# Patient Record
Sex: Male | Born: 1965
Health system: Southern US, Community
[De-identification: ages and names within clinical notes are randomized; demographics above are authoritative.]

## PROBLEM LIST (undated history)

## (undated) DIAGNOSIS — I10 Essential (primary) hypertension: Secondary | ICD-10-CM

## (undated) DIAGNOSIS — I5030 Unspecified diastolic (congestive) heart failure: Secondary | ICD-10-CM

## (undated) DIAGNOSIS — I509 Heart failure, unspecified: Secondary | ICD-10-CM

## (undated) DIAGNOSIS — J9601 Acute respiratory failure with hypoxia: Secondary | ICD-10-CM

## (undated) DIAGNOSIS — N289 Disorder of kidney and ureter, unspecified: Secondary | ICD-10-CM

## (undated) HISTORY — PX: OTHER SURGICAL HISTORY: SHX169

## (undated) HISTORY — PX: SPINE SURGERY: SHX786

## (undated) HISTORY — PX: JOINT REPLACEMENT: SHX530

## (undated) HISTORY — PX: ROTATOR CUFF REPAIR: SHX139

---

## 1998-08-12 ENCOUNTER — Emergency Department (HOSPITAL_COMMUNITY): Admission: EM | Admit: 1998-08-12 | Discharge: 1998-08-12 | Payer: Self-pay

## 1998-11-28 ENCOUNTER — Emergency Department (HOSPITAL_COMMUNITY): Admission: EM | Admit: 1998-11-28 | Discharge: 1998-11-28 | Payer: Self-pay | Admitting: Emergency Medicine

## 2000-04-14 ENCOUNTER — Emergency Department (HOSPITAL_COMMUNITY): Admission: EM | Admit: 2000-04-14 | Discharge: 2000-04-14 | Payer: Self-pay | Admitting: Emergency Medicine

## 2000-04-26 ENCOUNTER — Emergency Department (HOSPITAL_COMMUNITY): Admission: EM | Admit: 2000-04-26 | Discharge: 2000-04-26 | Payer: Self-pay | Admitting: Emergency Medicine

## 2000-05-03 ENCOUNTER — Emergency Department (HOSPITAL_COMMUNITY): Admission: EM | Admit: 2000-05-03 | Discharge: 2000-05-03 | Payer: Self-pay | Admitting: Emergency Medicine

## 2000-11-09 ENCOUNTER — Emergency Department (HOSPITAL_COMMUNITY): Admission: EM | Admit: 2000-11-09 | Discharge: 2000-11-09 | Payer: Self-pay

## 2000-11-11 ENCOUNTER — Emergency Department (HOSPITAL_COMMUNITY): Admission: EM | Admit: 2000-11-11 | Discharge: 2000-11-11 | Payer: Self-pay | Admitting: Emergency Medicine

## 2001-02-11 ENCOUNTER — Encounter: Payer: Self-pay | Admitting: Neurosurgery

## 2001-02-11 ENCOUNTER — Ambulatory Visit (HOSPITAL_COMMUNITY): Admission: RE | Admit: 2001-02-11 | Discharge: 2001-02-11 | Payer: Self-pay | Admitting: Neurosurgery

## 2001-03-10 ENCOUNTER — Encounter: Payer: Self-pay | Admitting: Neurosurgery

## 2001-03-10 ENCOUNTER — Ambulatory Visit (HOSPITAL_COMMUNITY): Admission: RE | Admit: 2001-03-10 | Discharge: 2001-03-10 | Payer: Self-pay | Admitting: Neurosurgery

## 2001-10-10 ENCOUNTER — Emergency Department (HOSPITAL_COMMUNITY): Admission: EM | Admit: 2001-10-10 | Discharge: 2001-10-10 | Payer: Self-pay

## 2001-10-10 ENCOUNTER — Encounter: Payer: Self-pay | Admitting: Emergency Medicine

## 2004-06-12 ENCOUNTER — Emergency Department (HOSPITAL_COMMUNITY): Admission: EM | Admit: 2004-06-12 | Discharge: 2004-06-12 | Payer: Self-pay | Admitting: Emergency Medicine

## 2004-11-09 ENCOUNTER — Ambulatory Visit (HOSPITAL_COMMUNITY): Admission: RE | Admit: 2004-11-09 | Discharge: 2004-11-09 | Payer: Self-pay | Admitting: Orthopedic Surgery

## 2004-11-26 ENCOUNTER — Encounter: Admission: RE | Admit: 2004-11-26 | Discharge: 2004-11-26 | Payer: Self-pay | Admitting: Orthopedic Surgery

## 2004-12-06 ENCOUNTER — Ambulatory Visit (HOSPITAL_BASED_OUTPATIENT_CLINIC_OR_DEPARTMENT_OTHER): Admission: RE | Admit: 2004-12-06 | Discharge: 2004-12-06 | Payer: Self-pay | Admitting: Orthopedic Surgery

## 2005-03-07 ENCOUNTER — Encounter: Admission: RE | Admit: 2005-03-07 | Discharge: 2005-04-10 | Payer: Self-pay | Admitting: Orthopedic Surgery

## 2006-07-13 ENCOUNTER — Emergency Department (HOSPITAL_COMMUNITY): Admission: EM | Admit: 2006-07-13 | Discharge: 2006-07-14 | Payer: Self-pay | Admitting: Emergency Medicine

## 2007-12-13 ENCOUNTER — Emergency Department (HOSPITAL_COMMUNITY): Admission: EM | Admit: 2007-12-13 | Discharge: 2007-12-13 | Payer: Self-pay | Admitting: Emergency Medicine

## 2009-06-07 ENCOUNTER — Emergency Department (HOSPITAL_COMMUNITY): Admission: EM | Admit: 2009-06-07 | Discharge: 2009-06-07 | Payer: Self-pay | Admitting: Emergency Medicine

## 2010-11-09 ENCOUNTER — Encounter: Payer: Self-pay | Admitting: Orthopedic Surgery

## 2011-01-25 LAB — POCT I-STAT, CHEM 8
Glucose, Bld: 101 mg/dL — ABNORMAL HIGH (ref 70–99)
HCT: 50 % (ref 39.0–52.0)
Hemoglobin: 17 g/dL (ref 13.0–17.0)
Potassium: 3.7 mEq/L (ref 3.5–5.1)
Sodium: 140 mEq/L (ref 135–145)

## 2011-03-07 NOTE — Op Note (Signed)
Curtis Patton, Curtis Patton               ACCOUNT NO.:  [id number]   MEDICAL RECORD NO.:  [id number]          PATIENT TYPE:  AMB   LOCATION:  DSC                          FACILITY:  [facility]   PHYSICIAN:  Katy Fitch. Sypher Jr., M.D.DATE OF BIRTH:  02/07/1966   DATE OF PROCEDURE:  12/06/2004  DATE OF DISCHARGE:                                 OPERATIVE REPORT   ,   PREOPERATIVE DIAGNOSES:  1.  Large intramedullary lesion of right proximal humerus documented plain      films and MRI with differential diagnosis of unicameral bone cyst versus      fibrous dysplasia versus possible low-grade chondroid tumor, status post      unsuccessful needle biopsy by Dr. Antionette Patton of radiology.  2.  MRI-documented rotator cuff tear of right supraspinatus and      infraspinatus tendons with acromioclavicular arthropathy and chronic      stage III impingement of right shoulder.   POSTOPERATIVE DIAGNOSES:  1.  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  2.  Ninety percent deep surface tear of supraspinatus and infraspinatus      documented by MRI and digital photography.  3.  Acromioclavicular arthropathy leading to chronic sub-acromioclavicular      impingement.   OPERATION:  1.  Open biopsy of right proximal humeral lesion with curettage of probable      simple bone cyst and instillation of Depo-Medrol.  2.  Arthroscopic evaluation of right glenohumeral joint documenting deep      surface rotator cuff tear, followed by open repair of supraspinatus and      infraspinatus tendons to a decorticated greater tuberosity with multiple      suture anchors and through-bone McLaughlin sutures.  3.  Open resection of distal clavicle and subacromial decompression.   OPERATING SURGEON:  Katy Fitch. Sypher, M.D.   ASSISTANT:  Curtis Patton, P.A.   ANESTHESIA:  General  endotracheal.   SUPERVISING ANESTHESIOLOGIST:  Janetta Hora. Gelene Mink, M.D.   INDICATIONS:  Curtis Patton is a 45 year old mane referred by Dr.  Renaye Patton for evaluation and management of a painful right shoulder.   Clinical examination revealed signs of probable impingement and weakness of  abduction suggestive the rotator cuff tear.  Plain x-rays of the shoulder  documented a large intermaxillary lesion of the proximal humerus that had a  smooth border without signs of lysis suggestive of a simple bone cyst versus  fibrous dysplasia versus a possible chondroid lesion.   Curtis Patton had significant AC arthropathy with a rather unusual anatomy of  AC joint with a large lateral overlap of the clavicle on the acromion with a  very prominent inferior acromion.   There was marked sclerosis of the greater tuberosity consistent with chronic  rotator cuff injury.   He was referred for MRI of the shoulder which documented a 90% deep surface  rotator cuff tear and revealed that his lesion highlighted on T2  in the  proximal humerus.   The radiologist proposed a needle biopsy that was attempted by Dr. Antionette Patton in the week prior to surgery. Unfortunately, Dr. Barrington Patton did not  obtain an adequate amount material from the lesion for diagnosis.   Curtis Patton wanted to proceed with open bone biopsy for diagnosis as the  radiologist could not give [place] a firm diagnosis that this was not a chondroid  lesion.   After documentation of the rotator cuff tear, we recommended proceeding with  a combined procedure at this time, initially with the open bone biopsy with  plans to abort the rotator cuff procedure if we found a possible malignancy.   PROCEDURE:  Curtis Patton was brought to operating room and placed in a  supine position on the table.   Following anesthesia consultation by Dr. Gelene Mink, an interscalene block  was placed holding area.   Curtis Patton was transferred to the operating  room and placed in supine  position on the operating table.   Following induction general orotracheal anesthesia, he was carefully  positioned in a beach-chair position with  the aid of a torso and head-  holder designed for shoulder arthroscopy.   The entire right upper extremity and forequarter were prepped with DuraPrep  and draped with impervious arthroscopy drapes.   A spinal needle was used with a sterile C-arm fluoroscope to identify the  medullary lesion of the humerus.   A marking pen was used to outline the lesion.  We determine that this would  be appropriately approached through a deltopectoral/axillary incision and  should be reasonably cosmetic.   After administration of 1 gram of Ancef as an IV prophylactic antibiotic.  the procedure commenced with a 4-cm incision in the deltopectoral groove,  extending towards axillary fold.   The cephalic vein was identified between the deltoid and the pectoralis  major.   Blunt retractors were placed beneath the deltoid, exposing the insertion of  the pectoralis major.   The pectoralis major insertion was incised in a T-shaped manner, splitting  the fibers medially and elevating a small portion of the tendon off of the  humerus.   Through this gap in the tendon, I performed a provisional location of the  lesion by placing a bone awl followed by a small-caliber drill directly into  the lesion.   We then proceeded to create a diamond-shaped pattern of small drill holes  that were enlarged with a 3/16-inch drill to create a round window into the  proximal humeral lesion.   When we entered the lesion, there was simply a fluid-filled space.  There  was no blood under pressure,  amber fluid that we have associated with  childhood cysts and no solid material that one would associate with a  chondroid lesion.   A series of fine curettes were used within the intermaxillary canal to carefully scrape the wall cyst and try to  recover some type of membrane or  chondroid material.   We basically recovered serum and blood.   At that point, I elected to inject the cyst with 80 mg of Depo-Medrol in  effort to help the cyst involution.   The entry hole was then covered by closure of the flaps of the pectoralis  major tendon, which should seal with Depo-Medrol in place.   The wound was irrigated thoroughly and the skin repaired.  There was no  effort to repair the deltopectoral interval.  The skin was repaired with  subdermal  sutures of 3-0 Vicryl and intradermal 3-0 Prolene with Steri-  Strips.   The instruments used for the bone biopsy were discarded, followed by  preparation for shoulder arthroscopy.   A spinal needle was placed within the glenohumeral joint and used to distend  the joint with 20 mL of sterile saline, followed by placement of the  arthroscope in the posterior portal with a blunt trocar.  Diagnostic  arthroscopy immediately documented the cuff tear.  The remainder of the  glenohumeral joint was normal with a normal-appearing labrum, subscapularis,  normal anterior glenohumeral ligaments, normal superior and inferior recess  and a normal-appearing posterior teres minor tendon.   Most of supraspinatus and a portion anterior infraspinatus had a  degenerative tear that had fully separated from the greater tuberosity and a  small bursal covering laterally.   Photographic documentation was accomplished followed by removal of the scope  and placement of the scope and subacromial space.  The bursa was intact over  the tear in the subacromial space.   At this point we converted to an open repair of the tendon.  The unusual  anatomy at the [facility] joint made open resection of the distal clavicle the  appropriate choice.   The anterior acromion and AC joint were exposed through a 3-cm incision  followed by careful elevation of the anterior deltoid off the capsule of AC  joint and the anterior  acromion.  There was a very complex anatomy of the [facility]  joint.  The clavicle overlapped the acromion approximately 1.5 cm and had a  rather large inferior osteophyte and broad contour to the underlapped  portion of the acromion.  There was a thick coracoacromial ligament  underneath the anterior acromion.   The acromion was leveled to a type 1 morphology and the coracoacromial  ligament released.  The distal 10 mm of clavicle were resected at the most  medial portion of the acromion and the overlapping portion was resected. A  rongeur was used to remove the sharp medial corner of the acromion, followed  by use of a power bur to contour the acromion laterally and medially to a  flat surface.   The tear was easily palpated, followed by incision of the bursa, revealing a  4-cm-wide strip of necrotic tendon that had separated from the greater  tuberosity.   The tendon was thoroughly debrided with a sharp rongeur, followed by decortication of the tuberosity with a power bur and drilling a number of  holes with a 0.05-inch Kirschner wire to facilitate blood supply returning  to the tendon.   Two Bio corkscrew anchors were placed, one deep to the supraspinatus and one  deep to the infraspinatus, followed by a McLaughlin baseball stitch at the T  portion of the incision between the tendons.   The McLaughlin stitch was placed through bone tunnels and tied over the  lateral cortex, followed by tying a total of 4 mattress sutures, insetting  the supraspinatus and infraspinatus into the decorticated tuberosity.   A very satisfactory repair was achieved.   The scope was then replaced in the shoulder joint and the repair confirmed  to be anatomic.   The wound was then irrigated thoroughly sterile saline, followed by repair  the anterior third of deltoid to the trapezius muscle, closing the dead  space created by distal clavicle resection, and repair of the anterior third  of the deltoid to the  acromion.   There were no apparent complications.   Mr. Burby  wounds were then thoroughly irrigated and repaired with  subdermal sutures of 0 and 3-0 Vicryl and intradermal 3-0 Prolene with Steri-  Strips.   A compressive dressing was applied with Xeroflo, sterile gauze and Hypafix.   Mr. Duque will [facility] and we anticipate  discharge in 24 hours after dressing change to OpSite dressing.      RVS/MEDQ  D:  12/06/2004  T:  12/07/2004  Job:  [id number]

## 2011-03-07 NOTE — Op Note (Signed)
[facility]  Patient:    Curtis Patton, Curtis Patton                      MRN: [id number] Proc. Date: 02/11/01 Adm. Date:  [id number] Disc. Date: [id number] Attending:  Gerald Dexter                           Operative Report  PREOPERATIVE DIAGNOSIS:  Herniated disk at C6-7 right.  POSTOPERATIVE DIAGNOSIS:  Herniated disk at C6-7 right.  OPERATION PERFORMED:  C6-7 anterior cervical diskectomy with fibular bone bank fusion followed by Zephyr anterior cervical plating.  SURGEON:  Reinaldo Meeker, M.D.  ASSISTANT:  Donalee Citrin, Montez Hageman.  ANESTHESIA:  DESCRIPTION OF PROCEDURE:  After being placed in the supine position in 10 pounds halter traction, the patients neck was prepped and draped in the usual sterile fashion.  A localizing x-ray was taken prior to incision to identify the appropriate level.  A transverse incision was made at the right anterior neck starting at the midline heading toward the medial aspect of the left sternocleidomastoid muscle.  The platysma muscle was then incised transversely.  The natural fascial plane between the strap muscles medially and the sternocleidomastoid laterally was identified and followed down to the anterior aspect of the cervical spine.  The longus colli muscles were identified and split in the midline, stripped away bilaterally with the [employer] manager.  A self-retaining retractor was placed for exposure.  A second fluoroscopy was used to identify approach to the C6-7 level and this was correct.  Using the 15 blade, the annulus of the C6-7 disk was incised.  Using pituitary rongeurs and curets, approximately 90% of the disk material was removed.  A high speed drill was used to widen the interspace.  The microscope was draped, brought into the field and used for the remainder of the case.  Using microdissection technique, the remainder of the disk material down to the posterior longitudinal ligament  was removed. The ligament was then incised transversely.  The cut edges were removed with the Kerrison punch.  Inspection out the right C6-7 foramen yielded a large number of large disk fragments.  These were removed to give excellent decompression of the underlying C7 nerve root.  Proximal foraminal decompression was carried out on the left, asymptomatic side.  At this point inspection was carried out in all directions for any evidence of residual compression and none could be identified.  Large amounts of irrigation were carried out.  Measurements were taken and a 7 mm bone bank plug was reconstituted.  After irrigating once more and confirming hemostasis, the plug was then impacted without difficulty.  Microscope was removed and fluoroscopy brought back into the field.  Under fluoroscopic guidance, a separate anterior plate was placed by drilling pilot holes and then placing 15 mm screws x 4.  A locking mechanism was then engaged and then fluoroscopy showed the plate to be in excellent position at this time.  Once again, large amounts of irrigation were carried out and any bleeding controlled with bipolar coagulation.  The wound was then closed using interrupted Vicryl on the platysma muscle and inverted 5-0 PDS in the subcuticular layer and Steri-Strips on the skin.  Sterile dressing and collar were applied.  Patient was extubated and taken to the recovery room in stable condition. DD:  02/11/01 TD:  02/12/01 Job: [id number]

## 2011-07-11 LAB — CULTURE, ROUTINE-ABSCESS

## 2011-09-28 ENCOUNTER — Encounter: Payer: Self-pay | Admitting: Emergency Medicine

## 2011-09-28 ENCOUNTER — Emergency Department (HOSPITAL_COMMUNITY)
Admission: EM | Admit: 2011-09-28 | Discharge: 2011-09-28 | Disposition: A | Discharge status: Home / Self Care | Payer: Self-pay | Attending: Emergency Medicine | Admitting: Emergency Medicine

## 2011-09-28 DIAGNOSIS — G43909 Migraine, unspecified, not intractable, without status migrainosus: Secondary | ICD-10-CM | POA: Insufficient documentation

## 2011-09-28 DIAGNOSIS — F17200 Nicotine dependence, unspecified, uncomplicated: Secondary | ICD-10-CM | POA: Insufficient documentation

## 2011-09-28 DIAGNOSIS — I10 Essential (primary) hypertension: Secondary | ICD-10-CM | POA: Insufficient documentation

## 2011-09-28 DIAGNOSIS — Z79899 Other long term (current) drug therapy: Secondary | ICD-10-CM | POA: Insufficient documentation

## 2011-09-28 HISTORY — DX: Essential (primary) hypertension: I10

## 2011-09-28 MED ORDER — SUMATRIPTAN SUCCINATE 6 MG/0.5ML ~~LOC~~ SOLN
6.0000 mg | Freq: Once | SUBCUTANEOUS | Status: AC
  Administered 2011-09-28: 6 mg via SUBCUTANEOUS
  Filled 2011-09-28: qty 0.5

## 2011-09-28 MED ORDER — SUMATRIPTAN SUCCINATE 50 MG PO TABS: 50.0000 mg | ORAL_TABLET | ORAL | Status: DC | PRN

## 2011-09-28 NOTE — ED Notes (Signed)
Pt states having a migraine onset this am.

## 2011-09-28 NOTE — ED Provider Notes (Signed)
History     CSN: [id number] Arrival date & time: 09/28/2011  7:48 AM   First MD Initiated Contact with Patient 09/28/11 [id number]      Chief Complaint  Patient presents with  . Migraine    (Consider location/radiation/quality/duration/timing/severity/associated sxs/prior treatment) HPI Comments: Patient with history of migraines presents with onset of typical migraine symptoms beginning this morning at 1 AM. Patient typically Imitrex for his symptoms however he has run out of not taken today. Patient describes photophobia, phonophobia, vomiting which is typical. Patient denies vision change or aura. Patient denies head injury. Patient states he has some nasal congestion but otherwise no other symptoms. Patient's wife states he has been acting normally except he appears to be in pain.  Patient is a 45 y.o. male presenting with migraine. The history is provided by the patient and a relative.  Migraine This is a recurrent problem. The current episode started today. Associated symptoms include congestion, headaches, nausea and vomiting. Pertinent negatives include no abdominal pain, chest pain, coughing, fever, myalgias, neck pain, numbness, rash, sore throat or weakness.    Past Medical History  Diagnosis Date  . Migraine   . Hypertension     Past Surgical History  Procedure Date  . Neck   . Rotator cuff repair     History reviewed. No pertinent family history.  History  Substance Use Topics  . Smoking status: Current Everyday Smoker -- 2.0 packs/day    Types: Cigarettes, Cigars  . Smokeless tobacco: Not on file  . Alcohol Use: No      Review of Systems  Constitutional: Negative for fever.  HENT: Positive for congestion. Negative for sore throat, rhinorrhea, neck pain and neck stiffness.   Eyes: Negative for redness.  Respiratory: Negative for cough and shortness of breath.   Cardiovascular: Negative for chest pain.  Gastrointestinal: Positive for nausea and vomiting.  Negative for abdominal pain, diarrhea and constipation.  Genitourinary: Negative for dysuria.  Musculoskeletal: Negative for myalgias.  Skin: Negative for rash.  Neurological: Positive for headaches. Negative for dizziness, weakness and numbness.    Allergies  Review of patient's allergies indicates no known allergies.  Home Medications   Current Outpatient Rx  Name Route Sig Dispense Refill  . HYDROCHLOROTHIAZIDE 25 MG PO TABS Oral Take 25 mg by mouth every morning.      Marland Kitchen NAPROXEN SODIUM 220 MG PO TABS Oral Take 220 mg by mouth 2 (two) times daily with a meal. pain       BP 171/91  Pulse 62  Temp(Src) 98.4 F (36.9 C) (Oral)  Resp 18  SpO2 100%  Physical Exam  Nursing note and vitals reviewed. Constitutional: He is oriented to person, place, and time. He appears well-developed and well-nourished.       Patient lying in dark room.  HENT:  Head: Normocephalic and atraumatic.  Eyes: Conjunctivae are normal. Pupils are equal, round, and reactive to light. Right eye exhibits no discharge. Left eye exhibits no discharge.  Neck: Normal range of motion. Neck supple.  Cardiovascular: Normal rate, regular rhythm and normal heart sounds.   Pulmonary/Chest: Effort normal and breath sounds normal.  Musculoskeletal: He exhibits no edema.  Neurological: He is alert and oriented to person, place, and time. He has normal strength. No cranial nerve deficit or sensory deficit. Coordination normal. GCS eye subscore is 4. GCS verbal subscore is 5. GCS motor subscore is 6.  Skin: Skin is warm and dry.  Psychiatric: He has a normal mood and affect.  ED Course  Procedures (including critical care time)  Labs Reviewed - No data to display No results found.   1. Migraine     9:47 AM patient seen and examined. He is having his typical migrainous symptoms with head injury. Imitrex ordered. Will reevaluate.  11:30 AM migraine symptoms resolved. Will discharge to home with a prescription  for sumatriptan. Patient urged to follow up with primary care doctor for management of his headaches.  MDM  Patient with typical migraine symptoms, no neurological deficits. No concern for intracranial injury or subarachnoid hemorrhage.         Carolee Rota, [name] 09/28/11 1131

## 2011-09-29 NOTE — ED Provider Notes (Signed)
Evaluation and management procedures were performed by the mid-level provider (PA/NP/CNM) under my supervision/collaboration. I was present and available during the ED course. Jd Mccaster Y.   Gavin Pound. Colie Josten, MD 09/29/11 1153

## 2013-02-17 ENCOUNTER — Ambulatory Visit (INDEPENDENT_AMBULATORY_CARE_PROVIDER_SITE_OTHER): Payer: No Typology Code available for payment source | Admitting: Family Medicine

## 2013-02-17 VITALS — BP 143/89 | HR 70 | Temp 97.9°F | Resp 18 | Ht 73.0 in | Wt 209.0 lb

## 2013-02-17 DIAGNOSIS — B350 Tinea barbae and tinea capitis: Secondary | ICD-10-CM

## 2013-02-17 DIAGNOSIS — L02818 Cutaneous abscess of other sites: Secondary | ICD-10-CM

## 2013-02-17 DIAGNOSIS — Z79899 Other long term (current) drug therapy: Secondary | ICD-10-CM

## 2013-02-17 DIAGNOSIS — L03811 Cellulitis of head [any part, except face]: Secondary | ICD-10-CM

## 2013-02-17 MED ORDER — CIPROFLOXACIN HCL 500 MG PO TABS: 500.0000 mg | ORAL_TABLET | Freq: Two times a day (BID) | ORAL | Status: DC

## 2013-02-17 MED ORDER — TERBINAFINE HCL 250 MG PO TABS: 250.0000 mg | ORAL_TABLET | Freq: Every day | ORAL | Status: DC

## 2013-02-17 NOTE — Progress Notes (Signed)
  Subjective:    Patient ID: Curtis Patton, male    DOB: [birth year], 47 y.o.   MRN: [id number]  HPI Curtis Patton is a 47 y.o. male  Bumps on back of head - noticed few months ago - occasional discharge. No fever, multiple ones on back of head now, and with one area next to eye - past few months.  Had been getting hair cut closer, and hair oil when this first started.  Seen by other medical clinic 1 month ago - treated with cortisone cream and some type of shampoo - felt like it made it worse.  Tx; otc medicated shampoo.   Review of Systems  Constitutional: Negative for fever and chills.       Objective:   Physical Exam  Vitals reviewed. Constitutional: He is oriented to person, place, and time. He appears well-developed and well-nourished.  HENT:  Head:    Right Ear: External ear normal.  Left Ear: External ear normal.  Eyes: EOM are normal. Pupils are equal, round, and reactive to light.    Pulmonary/Chest: Effort normal.  Neurological: He is alert and oriented to person, place, and time.  Skin: Skin is warm.  As above. No LAD in scalp appreciated.   Psychiatric: He has a normal mood and affect. His behavior is normal.  scraping of scalp - noted fungal elements under scope.      Assessment & Plan:  Curtis CUPPLES is a 47 y.o. male Tinea capitis - Plan: terbinafine (LAMISIL) 250 MG tablet  Abscess or cellulitis of scalp - Plan: ciprofloxacin (CIPRO) 500 MG tablet, Wound culture  High risk medication use - Plan: Comprehensive metabolic panel  Suspected initial  Tinea capitis with secondary abscesses vs. Kerion. I and D per procedure note -  Cover with antifungal for 4 weeks, and abx that has pseudomonal coverage - cipro for 10 days. . Warm compresses. rtc precautions and instructions below.   Meds ordered this encounter  Medications  . terbinafine (LAMISIL) 250 MG tablet    Sig: Take 1 tablet (250 mg total) by mouth daily.    Dispense:  28 tablet    Refill:  0    . ciprofloxacin (CIPRO) 500 MG tablet    Sig: Take 1 tablet (500 mg total) by mouth 2 (two) times daily.    Dispense:  20 tablet    Refill:  0    Patient Instructions  Start the antifungal and antibiotic for the infection in your scalp.  Hot compresses 3-4 times per day over area to allow these area to drain.  Let your hair grow out a little longer to lessen chance of recurrence. Recheck in next week to 10 days if not improving. Return to the clinic or go to the nearest emergency room if any of your symptoms worsen or new symptoms occur. Your should receive a call or letter about your lab results within the next week to 10 days.  '

## 2013-02-17 NOTE — Progress Notes (Signed)
Procedure Note: Verbal consent obtained from the patient.  Local anesthesia with 1cc 2% plain lidocaine.  Betadine prep.  Three small incisions made with 11 blade over most fluctuant areas.  Moderate amount of purulence expressed from most superior lesion.  Minimal purulence expressed from inferior lesions.  Hemostasis with direct pressure.  Dressing applied.  Wound care discussed.

## 2013-02-17 NOTE — Patient Instructions (Addendum)
Start the antifungal and antibiotic for the infection in your scalp.  Hot compresses 3-4 times per day over area to allow these area to drain.  Let your hair grow out a little longer to lessen chance of recurrence. Recheck in next week to 10 days if not improving. Return to the clinic or go to the nearest emergency room if any of your symptoms worsen or new symptoms occur. Your should receive a call or letter about your lab results within the next week to 10 days.  '

## 2013-02-18 LAB — COMPREHENSIVE METABOLIC PANEL
Albumin: 4.3 g/dL (ref 3.5–5.2)
CO2: 24 mEq/L (ref 19–32)
Glucose, Bld: 75 mg/dL (ref 70–99)
Sodium: 138 mEq/L (ref 135–145)
Total Bilirubin: 0.7 mg/dL (ref 0.3–1.2)
Total Protein: 7 g/dL (ref 6.0–8.3)

## 2013-02-21 LAB — WOUND CULTURE
Gram Stain: NONE SEEN
Gram Stain: NONE SEEN

## 2013-04-06 ENCOUNTER — Encounter (HOSPITAL_COMMUNITY): Payer: Self-pay

## 2013-04-06 ENCOUNTER — Emergency Department (INDEPENDENT_AMBULATORY_CARE_PROVIDER_SITE_OTHER)
Admission: EM | Admit: 2013-04-06 | Discharge: 2013-04-06 | Disposition: A | Discharge status: Home / Self Care | Payer: No Typology Code available for payment source | Source: Home / Self Care | Attending: Emergency Medicine | Admitting: Emergency Medicine

## 2013-04-06 DIAGNOSIS — L03211 Cellulitis of face: Secondary | ICD-10-CM

## 2013-04-06 DIAGNOSIS — L03818 Cellulitis of other sites: Secondary | ICD-10-CM

## 2013-04-06 DIAGNOSIS — L0201 Cutaneous abscess of face: Secondary | ICD-10-CM

## 2013-04-06 DIAGNOSIS — L02811 Cutaneous abscess of head [any part, except face]: Secondary | ICD-10-CM

## 2013-04-06 MED ORDER — HYDROCODONE-ACETAMINOPHEN 5-325 MG PO TABS: 1.0000 | ORAL_TABLET | Freq: Four times a day (QID) | ORAL | Status: DC | PRN

## 2013-04-06 MED ORDER — DOXYCYCLINE HYCLATE 100 MG PO CAPS: 100.0000 mg | ORAL_CAPSULE | Freq: Two times a day (BID) | ORAL | Status: DC

## 2013-04-06 NOTE — ED Notes (Signed)
Reports painful swollen areas on face, scalp x couple of months

## 2013-04-06 NOTE — ED Provider Notes (Signed)
Medical screening examination/treatment/procedure(s) were performed by non-physician practitioner and as supervising physician I was immediately available for consultation/collaboration.  Raynald Blend, MD 04/06/13 [phone]

## 2013-04-06 NOTE — ED Provider Notes (Signed)
History     CSN: [id number]  Arrival date & time 04/06/13  1001   First MD Initiated Contact with Patient 04/06/13 1029      Chief Complaint  Patient presents with  . Facial Swelling    (Consider location/radiation/quality/duration/timing/severity/associated sxs/prior treatment) HPI  47 yo bm presents today with multiple painful abscesses on back of scalp and right upper cheek.  Scalp has been ongoing x 4 months after he got a haircut.  Was treated by 2 urgent cares and most recently by [place] early May.  Given scripts for cipro and lamisil.  Abscesses never resolved with treatment.  They did send cultures but no predominant organism.  He admits foul smelling, purulent drainage from the back of the head.  Doing warm compresses.  Facial abscess for a few weeks.  No drainage.  Denies fever, chills, lightheadedness, vision changes.    Past Medical History  Diagnosis Date  . Migraine   . Hypertension     Past Surgical History  Procedure Laterality Date  . Neck    . Rotator cuff repair    . Joint replacement    . Spine surgery      History reviewed. No pertinent family history.  History  Substance Use Topics  . Smoking status: Current Every Day Smoker -- 2.00 packs/day    Types: Cigarettes, Cigars  . Smokeless tobacco: Not on file  . Alcohol Use: No      Review of Systems  Constitutional: Negative.   HENT: Positive for facial swelling (right upper cheek).        Scalp tenderness and drainage.  Eyes: Negative.   Respiratory: Negative.   Cardiovascular: Negative.   Gastrointestinal: Negative.   Endocrine: Negative.   Genitourinary: Negative.   Allergic/Immunologic: Negative.   Neurological: Negative.   Psychiatric/Behavioral: Negative.     Allergies  Review of patient's allergies indicates no known allergies.  Home Medications   Current Outpatient Rx  Name  Route  Sig  Dispense  Refill  . ciprofloxacin (CIPRO) 500 MG tablet   Oral   Take 1 tablet (500 mg  total) by mouth 2 (two) times daily.   20 tablet   0   . doxycycline (VIBRAMYCIN) 100 MG capsule   Oral   Take 1 capsule (100 mg total) by mouth every 12 (twelve) hours.   30 capsule   1   . hydrochlorothiazide (HYDRODIURIL) 25 MG tablet   Oral   Take 25 mg by mouth every morning.           Marland Kitchen HYDROcodone-acetaminophen (NORCO) 5-325 MG per tablet   Oral   Take 1 tablet by mouth every 6 (six) hours as needed for pain.   30 tablet   0   . naproxen sodium (ANAPROX) 220 MG tablet   Oral   Take 220 mg by mouth 2 (two) times daily with a meal. pain          . EXPIRED: SUMAtriptan (IMITREX) 50 MG tablet   Oral   Take 1 tablet (50 mg total) by mouth every 2 (two) hours as needed for migraine. Do not take more than 4 tablets in 24 hours.   10 tablet   0   . terbinafine (LAMISIL) 250 MG tablet   Oral   Take 1 tablet (250 mg total) by mouth daily.   28 tablet   0     BP 159/90  Pulse 86  Temp(Src) 98.4 F (36.9 C) (Oral)  Resp 18  SpO2  97%  Physical Exam  Constitutional: He is oriented to person, place, and time. He appears well-developed and well-nourished.  HENT:  He has large, multiple abscesses back of the scalp.  During exam two areas started draining foul, purulent fluid. These areas are tender to palpation.  Right upper cheek he does have a large abscess that is tender.  Some swelling around this area.    Eyes: EOM are normal. Pupils are equal, round, and reactive to light.  Neck: Normal range of motion.  Cardiovascular: Normal rate and regular rhythm.   Pulmonary/Chest: Effort normal and breath sounds normal.  Abdominal: Soft. Bowel sounds are normal.  Musculoskeletal: Normal range of motion.  Lymphadenopathy:    He has cervical adenopathy (right side, but nontender).  Neurological: He is alert and oriented to person, place, and time.  Skin: Skin is warm.  Psychiatric: He has a normal mood and affect.    ED Course  INCISION AND DRAINAGE Date/Time:  04/06/2013 12:11 PM Performed by: Zonia Kief Authorized by: Jimmie Molly Consent: Verbal consent obtained. written consent not obtained. Risks and benefits: risks, benefits and alternatives were discussed Consent given by: patient Patient understanding: patient states understanding of the procedure being performed Patient consent: the patient's understanding of the procedure matches consent given Procedure consent: procedure consent matches procedure scheduled Test results: test results available and properly labeled Site marked: the operative site was marked Imaging studies: imaging studies not available Patient identity confirmed: verbally with patient   (including critical care time)  Labs Reviewed  CULTURE, ROUTINE-ABSCESS  CULTURE, ROUTINE-ABSCESS  CULTURE, ROUTINE-ABSCESS  CULTURE, ROUTINE-ABSCESS   No results found.   1. Scalp abscess   2. Facial abscess       MDM  I&D of right upper cheek and back of scalp was performed today.  Tolerated procedure well and without complication.  Cultures sent x 4.  I spoke with Dr Aram Beecham snider with infectious disease today and patient will follow up with her this week.  Return to clinic in a couple of days with [place] if needed.  Plan discussed with patient along with potential risk and complications.  Understands that he must see infectious disease physician.    Meds ordered this encounter  Medications  . doxycycline (VIBRAMYCIN) 100 MG capsule    Sig: Take 1 capsule (100 mg total) by mouth every 12 (twelve) hours.    Dispense:  30 capsule    Refill:  1  . HYDROcodone-acetaminophen (NORCO) 5-325 MG per tablet    Sig: Take 1 tablet by mouth every 6 (six) hours as needed for pain.    Dispense:  30 tablet    Refill:  0           Zonia Kief, PA-C 04/06/13 1216

## 2013-04-09 LAB — CULTURE, ROUTINE-ABSCESS

## 2013-04-10 LAB — CULTURE, ROUTINE-ABSCESS

## 2013-04-12 ENCOUNTER — Encounter: Payer: Self-pay | Admitting: Internal Medicine

## 2013-04-12 ENCOUNTER — Telehealth: Payer: Self-pay | Admitting: *Deleted

## 2013-04-12 ENCOUNTER — Ambulatory Visit (INDEPENDENT_AMBULATORY_CARE_PROVIDER_SITE_OTHER): Payer: No Typology Code available for payment source | Admitting: Internal Medicine

## 2013-04-12 DIAGNOSIS — L7211 Pilar cyst: Secondary | ICD-10-CM

## 2013-04-12 MED ORDER — AMOXICILLIN-POT CLAVULANATE 875-125 MG PO TABS: 1.0000 | ORAL_TABLET | Freq: Two times a day (BID) | ORAL | Status: DC

## 2013-04-12 NOTE — Progress Notes (Signed)
  Subjective:    Patient ID: Curtis Patton, male    DOB: Nov 02, 1965, 47 y.o.   MRN: [id number]  HPI He comes in here for new patient evaluation. Over the last 2 months he has had what he describes as boils developing on his neck and his scalp. The have been located over his left eye, next is left eye, back of the scalp, top of his scalp. The develop swelling and have been drained twice in urgent care centers. The drainage is notable for being serosanguineous and not puslike. He has had no fever or chills associated with this. They're not particularly painful though they are somewhat itchy. He has never had that before. He has taken some antibiotics for this with little relief. He has been diagnosed with fungal infection such as tinea as well as bacterial infection but no positive cultures. Most recently he was in urgent care and sent here following that and did grow a streptococcal organism. He has noted some improvement with the doxycycline which she was given there. He otherwise though has noticed that these are always in the same place and essentially have never resolved despite drainage.   Review of Systems  Constitutional: Negative for fever, chills and fatigue.  HENT: Negative for sore throat and trouble swallowing.   Gastrointestinal: Negative for nausea and diarrhea.  Musculoskeletal: Negative for joint swelling.  Skin: Negative for rash.       Cystic lesions on scalp and face  Neurological: Negative for dizziness, light-headedness and headaches.  Hematological: Negative for adenopathy.       Objective:   Physical Exam  HENT:  Head:    Eyes: Right eye exhibits no discharge. Left eye exhibits no discharge. No scleral icterus.  Lymphadenopathy:    He has no cervical adenopathy.  Skin: No rash noted.  Psychiatric: He has a normal mood and affect. His behavior is normal.          Assessment & Plan:

## 2013-04-12 NOTE — Assessment & Plan Note (Signed)
Do not think these are consistent with an infection based on the lack of pus, no surrounding erythema, no fever or other signs of infection. Likely these are benign such as pilar cyst. I will have him seen by dermatology to see if these should be removed or biopsied. I also will give him a short course of Augmentin since he did have a streptococcal positive organism which would not be covered by doxycycline.  At this time, I will not schedule followup however will see him after his dermatology appointment if more of an infectious etiology is determined from a biopsy or other intervention.

## 2013-04-12 NOTE — Telephone Encounter (Signed)
Pt has been referred to [facility] for Pilar cysts.  Appt: 05/11/13 at 11:10.  Appointment made while patient in the office.  Pt agreeable to appointment, but would like one sooner if possible.  After patient left, another referral was made: Dr. Nita Sells on Wednesday, 04/20/13 at 12:00.  Attempted to call patient to notify of this appointment, but his contact numbers were not valid at this time. Andree Coss, RN

## 2013-04-14 ENCOUNTER — Telehealth: Payer: Self-pay | Admitting: *Deleted

## 2013-04-14 NOTE — Telephone Encounter (Signed)
Pt notified of referral appointment 04/20/13 at 12 with Dr. Nita Sells dermatology.  His referral to [facility] cancelled. Andree Coss, RN

## 2013-04-15 NOTE — ED Notes (Signed)
6/22 Multiple cultures of head: All show mod. Group C Strep except one that had multiple organisms present, none predominant.  No sensitivity's.  Pt. treated with Doxycyline.  Message to Zonia Kief PA.  6/27 Discussed with Zonia Kief PA.  He did not receive the message because it just got fixed yesterday.  He said he had instructed pt. to f/u with infectious disease.  He accessed pt.'s chart and said there was a note that he did follow up with Dr. Luciana Axe on 6/24 and they changed his Rx. to Augmentin to cover the strep and was much improved as there was no pus on recheck.  They referred him to Dr. Margo Aye - dermatology. Vassie Moselle 04/15/2013

## 2014-09-23 ENCOUNTER — Emergency Department (HOSPITAL_COMMUNITY)
Admission: EM | Admit: 2014-09-23 | Discharge: 2014-09-23 | Disposition: A | Discharge status: Home / Self Care | Payer: No Typology Code available for payment source | Attending: Emergency Medicine | Admitting: Emergency Medicine

## 2014-09-23 ENCOUNTER — Encounter (HOSPITAL_COMMUNITY): Payer: Self-pay | Admitting: Emergency Medicine

## 2014-09-23 DIAGNOSIS — Z720 Tobacco use: Secondary | ICD-10-CM | POA: Insufficient documentation

## 2014-09-23 DIAGNOSIS — Z79899 Other long term (current) drug therapy: Secondary | ICD-10-CM | POA: Insufficient documentation

## 2014-09-23 DIAGNOSIS — G43009 Migraine without aura, not intractable, without status migrainosus: Secondary | ICD-10-CM | POA: Insufficient documentation

## 2014-09-23 DIAGNOSIS — I10 Essential (primary) hypertension: Secondary | ICD-10-CM | POA: Insufficient documentation

## 2014-09-23 MED ORDER — DIPHENHYDRAMINE HCL 50 MG/ML IJ SOLN
25.0000 mg | Freq: Once | INTRAMUSCULAR | Status: AC
  Administered 2014-09-23: 25 mg via INTRAVENOUS

## 2014-09-23 MED ORDER — KETOROLAC TROMETHAMINE 30 MG/ML IJ SOLN
30.0000 mg | Freq: Once | INTRAMUSCULAR | Status: AC
  Administered 2014-09-23: 30 mg via INTRAVENOUS

## 2014-09-23 MED ORDER — NAPROXEN 500 MG PO TABS: 500.0000 mg | ORAL_TABLET | Freq: Two times a day (BID) | ORAL | Status: DC | PRN

## 2014-09-23 MED ORDER — SODIUM CHLORIDE 0.9 % IV BOLUS (SEPSIS)
1000.0000 mL | Freq: Once | INTRAVENOUS | Status: AC
  Administered 2014-09-23: 1000 mL via INTRAVENOUS

## 2014-09-23 MED ORDER — PROCHLORPERAZINE 25 MG RE SUPP: 25.0000 mg | Freq: Two times a day (BID) | RECTAL | Status: DC | PRN

## 2014-09-23 MED ORDER — METOCLOPRAMIDE HCL 5 MG/ML IJ SOLN
10.0000 mg | Freq: Once | INTRAMUSCULAR | Status: AC
  Administered 2014-09-23: 10 mg via INTRAVENOUS

## 2014-09-23 NOTE — ED Provider Notes (Signed)
CSN: [id number]     Arrival date & time 09/23/14  0325 History   First MD Initiated Contact with Patient 09/23/14 0434     Chief Complaint  Patient presents with  . Migraine     (Consider location/radiation/quality/duration/timing/severity/associated sxs/prior Treatment) HPI  Patient states he started getting a right frontal and right facial sharp burning throbbing headache that comes and goes for the past week. He states the pain can last up to 10 minutes. He did have nausea last night without vomiting. He denies any visual changes. He denies numbness or tingling in his extremities. He states he's had this headache before for the past 1-2 years. He states it happens 1-2 times a year. He does describe light and noise sensitivity.  PCP Dr Kennon Holter  Past Medical History  Diagnosis Date  . Migraine   . Hypertension    Past Surgical History  Procedure Laterality Date  . Neck    . Rotator cuff repair    . Joint replacement    . Spine surgery     History reviewed. No pertinent family history. History  Substance Use Topics  . Smoking status: Current Every Day Smoker -- 2.00 packs/day    Types: Cigars  . Smokeless tobacco: Not on file  . Alcohol Use: Yes     Comment: rarely  employed as a cook  Review of Systems  All other systems reviewed and are negative.     Allergies  Review of patient's allergies indicates no known allergies.  Home Medications   Prior to Admission medications   Medication Sig Start Date End Date Taking? Authorizing Provider  aspirin-acetaminophen-caffeine (EXCEDRIN MIGRAINE) [phone] MG per tablet Take 1 tablet by mouth every 6 (six) hours as needed for headache.   Yes Historical Provider, MD  lisinopril-hydrochlorothiazide (PRINZIDE,ZESTORETIC) 20-25 MG per tablet Take 1 tablet by mouth daily.  03/28/13  Yes Historical Provider, MD  Multiple Vitamin (MULTIVITAMIN WITH MINERALS) TABS tablet Take 1 tablet by mouth daily.   Yes Historical Provider, MD   amoxicillin-clavulanate (AUGMENTIN) 875-125 MG per tablet Take 1 tablet by mouth 2 (two) times daily. Patient not taking: Reported on 09/23/2014 04/12/13   Thayer Headings, MD  doxycycline (VIBRAMYCIN) 100 MG capsule Take 1 capsule (100 mg total) by mouth every 12 (twelve) hours. Patient not taking: Reported on 09/23/2014 04/06/13   Benjiman Core, PA-C  HYDROcodone-acetaminophen [facility]) 5-325 MG per tablet Take 1 tablet by mouth every 6 (six) hours as needed for pain. Patient not taking: Reported on 09/23/2014 04/06/13   Benjiman Core, PA-C  naproxen (NAPROSYN) 500 MG tablet Take 1 tablet (500 mg total) by mouth 2 (two) times daily as needed (headache). 09/23/14   [name], MD  prochlorperazine (COMPAZINE) 25 MG suppository Place 1 suppository (25 mg total) rectally every 12 (twelve) hours as needed for nausea or vomiting (or headache). 09/23/14   [name], MD  terbinafine (LAMISIL) 250 MG tablet Take 1 tablet (250 mg total) by mouth daily. Patient not taking: Reported on 09/23/2014 02/17/13   Wendie Agreste, MD   BP 151/81 mmHg  Pulse 93  Temp(Src) 97.9 F (36.6 C) (Oral)  Resp 18  Ht 6\' 2"  (1.88 m)  Wt 205 lb (92.987 kg)  BMI 26.31 kg/m2  SpO2 100%  Vital signs normal   Physical Exam  Constitutional: He is oriented to person, place, and time. He appears well-developed and well-nourished.  Non-toxic appearance. He does not appear ill. He appears distressed.  Appears uncomfortable  HENT:  Head: Normocephalic and atraumatic.  Right Ear: External ear normal.  Left Ear: External ear normal.  Nose: Nose normal. No mucosal edema or rhinorrhea.  Mouth/Throat: Oropharynx is clear and moist and mucous membranes are normal. No dental abscesses or uvula swelling.  Eyes: Conjunctivae and EOM are normal. Pupils are equal, round, and reactive to light.  Neck: Normal range of motion and full passive range of motion without pain. Neck supple.  Cardiovascular: Normal rate, regular rhythm and normal  heart sounds.  Exam reveals no gallop and no friction rub.   No murmur heard. Pulmonary/Chest: Effort normal and breath sounds normal. No respiratory distress. He has no wheezes. He has no rhonchi. He has no rales. He exhibits no tenderness and no crepitus.  Abdominal: Soft. Normal appearance and bowel sounds are normal. He exhibits no distension. There is no tenderness. There is no rebound and no guarding.  Musculoskeletal: Normal range of motion. He exhibits no edema or tenderness.  Moves all extremities well.   Neurological: He is alert and oriented to person, place, and time. He has normal strength. No cranial nerve deficit.  Skin: Skin is warm, dry and intact. No rash noted. No erythema. No pallor.  Psychiatric: He has a normal mood and affect. His speech is normal and behavior is normal. His mood appears not anxious.  Nursing note and vitals reviewed.   ED Course  Procedures (including critical care time)  Medications  sodium chloride 0.9 % bolus 1,000 mL (0 mLs Intravenous Stopped 09/23/14 0654)  metoCLOPramide (REGLAN) injection 10 mg (10 mg Intravenous Given 09/23/14 0607)  diphenhydrAMINE (BENADRYL) injection 25 mg (25 mg Intravenous Given 09/23/14 0606)  ketorolac (TORADOL) 30 MG/ML injection 30 mg (30 mg Intravenous Given 09/23/14 0607)    Recheck at 7 AM patient states his headache is gone. He sitting in a well lit room in no distress.  Labs Review Labs Reviewed - No data to display  Imaging Review No results found.   EKG Interpretation None      MDM   Final diagnoses:  Migraine without aura and without status migrainosus, not intractable    New Prescriptions   NAPROXEN (NAPROSYN) 500 MG TABLET    Take 1 tablet (500 mg total) by mouth 2 (two) times daily as needed (headache).   PROCHLORPERAZINE (COMPAZINE) 25 MG SUPPOSITORY    Place 1 suppository (25 mg total) rectally every 12 (twelve) hours as needed for nausea or vomiting (or headache).    Plan  discharge  Rolland Porter, MD, Alanson Aly, MD 09/23/14 [phone]

## 2014-09-23 NOTE — Discharge Instructions (Signed)
Drink plenty of fluids. If your headache returns, try the prescribed medications. Recheck if you get worse or have any problems listed on the head injury sheet.

## 2016-04-10 ENCOUNTER — Ambulatory Visit (HOSPITAL_COMMUNITY)
Admission: EM | Admit: 2016-04-10 | Discharge: 2016-04-10 | Disposition: A | Discharge status: Home / Self Care | Payer: 59 | Attending: Family Medicine | Admitting: Family Medicine

## 2016-04-10 ENCOUNTER — Encounter (HOSPITAL_COMMUNITY): Payer: Self-pay | Admitting: Emergency Medicine

## 2016-04-10 DIAGNOSIS — T25231A Burn of second degree of right toe(s) (nail), initial encounter: Secondary | ICD-10-CM

## 2016-04-10 MED ORDER — SILVER SULFADIAZINE 1 % EX CREA: 1.0000 "application " | TOPICAL_CREAM | Freq: Every day | CUTANEOUS | Status: DC

## 2016-04-10 NOTE — Discharge Instructions (Signed)
Burn Care Keep the blister intact as long as possible. Once it ruptures apply the Silvadene cream once a day and be sure to wash it in cool running water at least once or twice a day. After it is washed apply the Silvadene cream and then wrapped with a small first aid bandage that can be found at any drugstore [employer]. Watch for any signs of infection such as redness, swelling, pus, drainage, extending redness into the foot, red streaks and increased pain. For any of these problems recheck promptly. Your skin is a natural barrier to infection. It is the largest organ of your body. Burns damage this natural protection. To help prevent infection, it is very important to follow your caregiver's instructions in the care of your burn. Burns are classified as:  First degree. There is only redness of the skin (erythema). No scarring is expected.  Second degree. There is blistering of the skin. Scarring may occur with deeper burns.  Third degree. All layers of the skin are injured, and scarring is expected. HOME CARE INSTRUCTIONS   Wash your hands well before changing your bandage.  Change your bandage as often as directed by your caregiver.  Remove the old bandage. If the bandage sticks, you may soak it off with cool, clean water.  Cleanse the burn thoroughly but gently with mild soap and water.  Pat the area dry with a clean, dry cloth.  Apply a thin layer of antibacterial cream to the burn.  Apply a clean bandage as instructed by your caregiver.  Keep the bandage as clean and dry as possible.  Elevate the affected area for the first 24 hours, then as instructed by your caregiver.  Only take over-the-counter or prescription medicines for pain, discomfort, or fever as directed by your caregiver. SEEK IMMEDIATE MEDICAL CARE IF:   You develop excessive pain.  You develop redness, tenderness, swelling, or red streaks near the burn.  The burned area develops yellowish-white fluid (pus) or  a bad smell.  You have a fever. MAKE SURE YOU:   Understand these instructions.  Will watch your condition.  Will get help right away if you are not doing well or get worse.   This information is not intended to replace advice given to you by your health care provider. Make sure you discuss any questions you have with your health care provider.   Document Released: 10/06/2005 Document Revised: 12/29/2011 Document Reviewed: 02/26/2011 Elsevier Interactive Patient Education [employer].

## 2016-04-10 NOTE — ED Notes (Signed)
Pt reports that on Tuesday while taking out a pan from the oven, the fluid poured onto right greater toe  Has now developed a blister at sight... Pain is 5/10  Slow gait.... A&O x4... No acute distress.

## 2016-04-10 NOTE — ED Provider Notes (Signed)
CSN: [id number]     Arrival date & time 04/10/16  1336 History   First MD Initiated Contact with Patient 04/10/16 1435     Chief Complaint  Patient presents with  . Foot Burn   (Consider location/radiation/quality/duration/timing/severity/associated sxs/prior Treatment) HPI Comments: 50-year-old male was cooking and spilled a few drops of hot grease on his right toe. This went through the shoe and tripped onto the dorsum of the right great toe. He presents today with an intact blister over the dorsum of the proximal phalanx of the great toe. There is no surrounding swelling or erythema. No other injuries. No cellulitis or lymphangitis. He just wanted to get checked and a note for work today. He states his last tetanus was less than 5 years ago.   Past Medical History  Diagnosis Date  . Migraine   . Hypertension    Past Surgical History  Procedure Laterality Date  . Neck    . Rotator cuff repair    . Joint replacement    . Spine surgery     No family history on file. Social History  Substance Use Topics  . Smoking status: Current Every Day Smoker -- 2.00 packs/day    Types: Cigars  . Smokeless tobacco: None  . Alcohol Use: Yes     Comment: rarely    Review of Systems  Constitutional: Negative for fever, activity change and fatigue.  HENT: Negative.   Musculoskeletal: Negative.   Skin: Positive for wound.       As per history of present illness  Neurological: Negative.   All other systems reviewed and are negative.   Allergies  Review of patient's allergies indicates no known allergies.  Home Medications   Prior to Admission medications   Medication Sig Start Date End Date Taking? Authorizing Provider  aspirin-acetaminophen-caffeine (EXCEDRIN MIGRAINE) [phone] MG per tablet Take 1 tablet by mouth every 6 (six) hours as needed for headache.    Historical Provider, MD  lisinopril-hydrochlorothiazide (PRINZIDE,ZESTORETIC) 20-25 MG per tablet Take 1 tablet by mouth  daily.  03/28/13   Historical Provider, MD  Multiple Vitamin (MULTIVITAMIN WITH MINERALS) TABS tablet Take 1 tablet by mouth daily.    Historical Provider, MD  naproxen (NAPROSYN) 500 MG tablet Take 1 tablet (500 mg total) by mouth 2 (two) times daily as needed (headache). 09/23/14   Rolland Porter, MD  prochlorperazine (COMPAZINE) 25 MG suppository Place 1 suppository (25 mg total) rectally every 12 (twelve) hours as needed for nausea or vomiting (or headache). 09/23/14   Rolland Porter, MD  silver sulfADIAZINE (SILVADENE) 1 % cream Apply 1 application topically daily. 04/10/16   Janne Napoleon, NP   Meds Ordered and Administered this Visit  Medications - No data to display  BP 174/94 mmHg  Pulse 94  Temp(Src) 98.9 F (37.2 C) (Oral)  Resp 18  SpO2 100% No data found.   Physical Exam  Constitutional: He is oriented to person, place, and time. He appears well-developed and well-nourished. No distress.  Eyes: EOM are normal.  Neck: Normal range of motion. Neck supple.  Cardiovascular: Normal rate.   Pulmonary/Chest: Effort normal. No respiratory distress.  Musculoskeletal: He exhibits no edema.  Neurological: He is alert and oriented to person, place, and time. He exhibits normal muscle tone.  Skin: Skin is warm and dry.  Intact blister to the right great toe dorsal aspect. No surrounding erythema, swelling or evidence of infection. No drainage or bleeding. No other wounds.  Psychiatric: He has a normal mood  and affect.  Nursing note and vitals reviewed.   ED Course  Procedures (including critical care time)  Labs Review Labs Reviewed - No data to display  Imaging Review No results found.   Visual Acuity Review  Right Eye Distance:   Left Eye Distance:   Bilateral Distance:    Right Eye Near:   Left Eye Near:    Bilateral Near:         MDM   1. Second degree burn of toe of right foot, initial encounter    Keep the blister intact as long as possible. Once it ruptures apply the  Silvadene cream once a day and be sure to wash it in cool running water at least once or twice a day. After it is washed apply the Silvadene cream and then wrapped with a small first aid bandage that can be found at any drugstore [employer]. Watch for any signs of infection such as redness, swelling, pus, drainage, extending redness into the foot, red streaks and increased pain. For any of these problems recheck promptly. Meds ordered this encounter  Medications  . silver sulfADIAZINE (SILVADENE) 1 % cream    Sig: Apply 1 application topically daily.    Dispense:  25 g    Refill:  0    Order Specific Question:  Supervising Provider    Answer:  Billy Fischer [[id number]]       Janne Napoleon, NP 04/10/16 1541

## 2016-06-12 ENCOUNTER — Encounter (HOSPITAL_COMMUNITY): Payer: Self-pay | Admitting: Emergency Medicine

## 2016-06-12 ENCOUNTER — Ambulatory Visit (HOSPITAL_COMMUNITY)
Admission: EM | Admit: 2016-06-12 | Discharge: 2016-06-12 | Disposition: A | Discharge status: Home / Self Care | Payer: 59 | Attending: Emergency Medicine | Admitting: Emergency Medicine

## 2016-06-12 DIAGNOSIS — Z23 Encounter for immunization: Secondary | ICD-10-CM

## 2016-06-12 DIAGNOSIS — S0081XA Abrasion of other part of head, initial encounter: Secondary | ICD-10-CM | POA: Diagnosis not present

## 2016-06-12 DIAGNOSIS — L089 Local infection of the skin and subcutaneous tissue, unspecified: Secondary | ICD-10-CM

## 2016-06-12 MED ORDER — CLINDAMYCIN HCL 300 MG PO CAPS: 300.0000 mg | ORAL_CAPSULE | Freq: Three times a day (TID) | ORAL | 0 refills | Status: DC

## 2016-06-12 MED ORDER — CEFTRIAXONE SODIUM 1 G IJ SOLR
INTRAMUSCULAR | Status: AC
Start: 2016-06-12 — End: 2016-06-12
  Filled 2016-06-12: qty 10

## 2016-06-12 MED ORDER — LIDOCAINE HCL (PF) 1 % IJ SOLN
INTRAMUSCULAR | Status: AC
  Filled 2016-06-12: qty 2

## 2016-06-12 MED ORDER — CEFTRIAXONE SODIUM 1 G IJ SOLR
1.0000 g | Freq: Once | INTRAMUSCULAR | Status: AC
  Administered 2016-06-12: 1 g via INTRAMUSCULAR

## 2016-06-12 MED ORDER — TETANUS-DIPHTH-ACELL PERTUSSIS 5-2.5-18.5 LF-MCG/0.5 IM SUSP
0.5000 mL | Freq: Once | INTRAMUSCULAR | Status: AC
  Administered 2016-06-12: 0.5 mL via INTRAMUSCULAR

## 2016-06-12 MED ORDER — TETANUS-DIPHTH-ACELL PERTUSSIS 5-2.5-18.5 LF-MCG/0.5 IM SUSP
INTRAMUSCULAR | Status: AC
  Filled 2016-06-12: qty 0.5

## 2016-06-12 NOTE — ED Provider Notes (Signed)
CSN: [id number]     Arrival date & time 06/12/16  1314 History   First MD Initiated Contact with Patient 06/12/16 1355     Chief Complaint  Patient presents with  . Cellulitis   (Consider location/radiation/quality/duration/timing/severity/associated sxs/prior Treatment) 50 year old male was outside yesterday evening and cutting wood and lambs. He believes he received a wound to his forehead. This morning he awoke with swelling to the forehead and around the wound that has extended approximately two thirds across the forehead and has some swelling that has drained to dependent areas involving the glabella and left eyelid. No apparent injuries to these areas. The forehead around the wound is tender. No current drainage. There is 1 small abrasion in the center of a raised area. No purulence. Denies involvement of the eye proper. Uncertain as to last tetanus.    Past Medical History:  Diagnosis Date  . Hypertension   . Migraine    Past Surgical History:  Procedure Laterality Date  . JOINT REPLACEMENT    . neck    . ROTATOR CUFF REPAIR    . SPINE SURGERY     History reviewed. No pertinent family history. Social History  Substance Use Topics  . Smoking status: Current Every Day Smoker    Packs/day: 2.00    Types: Cigars  . Smokeless tobacco: Never Used  . Alcohol use Yes     Comment: rarely    Review of Systems  Constitutional: Negative.  Negative for activity change, fatigue and fever.  HENT: Positive for facial swelling. Negative for congestion, ear discharge, ear pain, hearing loss, postnasal drip, sore throat, tinnitus and trouble swallowing.   Eyes: Negative for photophobia, pain, discharge, itching and visual disturbance.  Respiratory: Negative.   Cardiovascular: Negative.   Skin: Positive for color change and wound.  Neurological: Negative.   All other systems reviewed and are negative.   Allergies  Review of patient's allergies indicates no known allergies.  Home  Medications   Prior to Admission medications   Medication Sig Start Date End Date Taking? Authorizing Provider  aspirin-acetaminophen-caffeine (EXCEDRIN MIGRAINE) [phone] MG per tablet Take 1 tablet by mouth every 6 (six) hours as needed for headache.    Historical Provider, MD  clindamycin (CLEOCIN) 300 MG capsule Take 1 capsule (300 mg total) by mouth 3 (three) times daily. 06/12/16   Janne Napoleon, NP  lisinopril-hydrochlorothiazide (PRINZIDE,ZESTORETIC) 20-25 MG per tablet Take 1 tablet by mouth daily.  03/28/13   Historical Provider, MD  Multiple Vitamin (MULTIVITAMIN WITH MINERALS) TABS tablet Take 1 tablet by mouth daily.    Historical Provider, MD  naproxen (NAPROSYN) 500 MG tablet Take 1 tablet (500 mg total) by mouth 2 (two) times daily as needed (headache). 09/23/14   Rolland Porter, MD  prochlorperazine (COMPAZINE) 25 MG suppository Place 1 suppository (25 mg total) rectally every 12 (twelve) hours as needed for nausea or vomiting (or headache). 09/23/14   Rolland Porter, MD  silver sulfADIAZINE (SILVADENE) 1 % cream Apply 1 application topically daily. 04/10/16   Janne Napoleon, NP   Meds Ordered and Administered this Visit   Medications  Tdap (BOOSTRIX) injection 0.5 mL (not administered)  cefTRIAXone (ROCEPHIN) injection 1 g (not administered)    BP 160/99 (BP Location: Left Arm)   Pulse 87   Temp 98.2 F (36.8 C) (Oral)   SpO2 100%  No data found.   Physical Exam  Constitutional: He is oriented to person, place, and time. He appears well-developed and well-nourished. No distress.  HENT:  Right Ear: External ear normal.  Left Ear: External ear normal.  Nose: Nose normal.  Mouth/Throat: Oropharynx is clear and moist. No oropharyngeal exudate.  Small raised area approximately 2 cm over the left forehead with a central small superficial abrasion. There is swelling to the forehead extending to the right covering approximately two thirds of the forehead. This area with light erythema  Dependent swelling involving the glabella and the left upper eyelid. No tenderness to the lower forehead,, glabella or nose. There is minor tenderness over the wound only.  Eyes: EOM are normal.  Neck: Normal range of motion. Neck supple.  Cardiovascular: Normal rate, regular rhythm and normal heart sounds.   Pulmonary/Chest: Effort normal and breath sounds normal. No respiratory distress.  Musculoskeletal: He exhibits no edema.  Lymphadenopathy:    He has no cervical adenopathy.  Neurological: He is alert and oriented to person, place, and time. No cranial nerve deficit. He exhibits normal muscle tone. Coordination normal.  Skin: Skin is warm and dry.  Psychiatric: He has a normal mood and affect.  Nursing note and vitals reviewed.   Urgent Care Course   Clinical Course    Procedures (including critical care time)  Labs Review Labs Reviewed - No data to display  Imaging Review No results found.   Visual Acuity Review  Right Eye Distance:   Left Eye Distance:   Bilateral Distance:    Right Eye Near:   Left Eye Near:    Bilateral Near:         MDM   1. Abrasion of forehead, infected, initial encounter    Keep the forehead wound clean with soap and water at least twice a day. Apply warm compresses across the forehead. Take medication as directed. Keep head elevated. For worsening, new symptoms or problems, more swelling, redness, drainage of pus, headache, fever or chills sig medical attention promptly. Meds ordered this encounter  Medications  . Tdap (BOOSTRIX) injection 0.5 mL  . clindamycin (CLEOCIN) 300 MG capsule    Sig: Take 1 capsule (300 mg total) by mouth 3 (three) times daily.    Dispense:  30 capsule    Refill:  0    Order Specific Question:   Supervising Provider    Answer:   Melynda Ripple [[id number]]  . cefTRIAXone (ROCEPHIN) injection 1 g        Janne Napoleon, NP 06/12/16 1430

## 2016-06-12 NOTE — Discharge Instructions (Signed)
Keep the forehead wound clean with soap and water at least twice a day. Apply warm compresses across the forehead. Take medication as directed. Keep head elevated. For worsening, new symptoms or problems, more swelling, redness, drainage of pus, headache, fever or chills sig medical attention promptly.

## 2016-06-12 NOTE — ED Triage Notes (Signed)
Pt here for skin infection to forehead since yest am when he woke up... Sx include: swelling, redness, and tenderness  Also reports facial swelling around eye associated w/blurry vision  A&O x4... NAD

## 2018-08-09 ENCOUNTER — Emergency Department (HOSPITAL_COMMUNITY): Payer: Commercial Managed Care - PPO

## 2018-08-09 ENCOUNTER — Encounter (HOSPITAL_COMMUNITY): Payer: Self-pay | Admitting: Emergency Medicine

## 2018-08-09 ENCOUNTER — Inpatient Hospital Stay (HOSPITAL_COMMUNITY)
Admission: EM | Admit: 2018-08-09 | Discharge: 2018-08-11 | DRG: 291 | Disposition: A | Discharge status: Home Health | Payer: Commercial Managed Care - PPO | Attending: Family Medicine | Admitting: Family Medicine

## 2018-08-09 DIAGNOSIS — J9601 Acute respiratory failure with hypoxia: Secondary | ICD-10-CM | POA: Diagnosis not present

## 2018-08-09 DIAGNOSIS — I445 Left posterior fascicular block: Secondary | ICD-10-CM | POA: Diagnosis not present

## 2018-08-09 DIAGNOSIS — R079 Chest pain, unspecified: Secondary | ICD-10-CM | POA: Diagnosis not present

## 2018-08-09 DIAGNOSIS — I509 Heart failure, unspecified: Secondary | ICD-10-CM | POA: Insufficient documentation

## 2018-08-09 DIAGNOSIS — I169 Hypertensive crisis, unspecified: Secondary | ICD-10-CM

## 2018-08-09 DIAGNOSIS — Z7982 Long term (current) use of aspirin: Secondary | ICD-10-CM

## 2018-08-09 DIAGNOSIS — Z91041 Radiographic dye allergy status: Secondary | ICD-10-CM | POA: Diagnosis not present

## 2018-08-09 DIAGNOSIS — Z791 Long term (current) use of non-steroidal anti-inflammatories (NSAID): Secondary | ICD-10-CM

## 2018-08-09 DIAGNOSIS — Z8249 Family history of ischemic heart disease and other diseases of the circulatory system: Secondary | ICD-10-CM | POA: Diagnosis not present

## 2018-08-09 DIAGNOSIS — I5033 Acute on chronic diastolic (congestive) heart failure: Secondary | ICD-10-CM | POA: Diagnosis not present

## 2018-08-09 DIAGNOSIS — N183 Chronic kidney disease, stage 3 (moderate): Secondary | ICD-10-CM | POA: Diagnosis not present

## 2018-08-09 DIAGNOSIS — N289 Disorder of kidney and ureter, unspecified: Secondary | ICD-10-CM | POA: Diagnosis not present

## 2018-08-09 DIAGNOSIS — I130 Hypertensive heart and chronic kidney disease with heart failure and stage 1 through stage 4 chronic kidney disease, or unspecified chronic kidney disease: Secondary | ICD-10-CM | POA: Diagnosis not present

## 2018-08-09 DIAGNOSIS — R0602 Shortness of breath: Secondary | ICD-10-CM | POA: Diagnosis not present

## 2018-08-09 DIAGNOSIS — Z6832 Body mass index (BMI) 32.0-32.9, adult: Secondary | ICD-10-CM

## 2018-08-09 DIAGNOSIS — I5030 Unspecified diastolic (congestive) heart failure: Secondary | ICD-10-CM | POA: Diagnosis not present

## 2018-08-09 DIAGNOSIS — Z79899 Other long term (current) drug therapy: Secondary | ICD-10-CM

## 2018-08-09 DIAGNOSIS — Z9119 Patient's noncompliance with other medical treatment and regimen: Secondary | ICD-10-CM

## 2018-08-09 DIAGNOSIS — R0600 Dyspnea, unspecified: Secondary | ICD-10-CM | POA: Diagnosis not present

## 2018-08-09 DIAGNOSIS — I5031 Acute diastolic (congestive) heart failure: Secondary | ICD-10-CM

## 2018-08-09 DIAGNOSIS — F17290 Nicotine dependence, other tobacco product, uncomplicated: Secondary | ICD-10-CM | POA: Diagnosis present

## 2018-08-09 DIAGNOSIS — I10 Essential (primary) hypertension: Secondary | ICD-10-CM | POA: Diagnosis not present

## 2018-08-09 DIAGNOSIS — Z23 Encounter for immunization: Secondary | ICD-10-CM

## 2018-08-09 DIAGNOSIS — I160 Hypertensive urgency: Secondary | ICD-10-CM | POA: Diagnosis not present

## 2018-08-09 DIAGNOSIS — E669 Obesity, unspecified: Secondary | ICD-10-CM | POA: Diagnosis present

## 2018-08-09 DIAGNOSIS — F17210 Nicotine dependence, cigarettes, uncomplicated: Secondary | ICD-10-CM | POA: Diagnosis not present

## 2018-08-09 DIAGNOSIS — E876 Hypokalemia: Secondary | ICD-10-CM | POA: Diagnosis present

## 2018-08-09 DIAGNOSIS — R0609 Other forms of dyspnea: Secondary | ICD-10-CM

## 2018-08-09 HISTORY — DX: Disorder of kidney and ureter, unspecified: N28.9

## 2018-08-09 HISTORY — DX: Unspecified diastolic (congestive) heart failure: I50.30

## 2018-08-09 HISTORY — DX: Heart failure, unspecified: I50.9

## 2018-08-09 HISTORY — DX: Acute respiratory failure with hypoxia: J96.01

## 2018-08-09 LAB — CBC
HCT: 49.1 % (ref 39.0–52.0)
HEMOGLOBIN: 16.8 g/dL (ref 13.0–17.0)
MCH: 27.8 pg (ref 26.0–34.0)
MCHC: 34.2 g/dL (ref 30.0–36.0)
MCV: 81.3 fL (ref 80.0–100.0)
NRBC: 0 % (ref 0.0–0.2)
Platelets: 241 10*3/uL (ref 150–400)
RBC: 6.04 MIL/uL — AB (ref 4.22–5.81)
RDW: 13.9 % (ref 11.5–15.5)
WBC: 10.1 10*3/uL (ref 4.0–10.5)

## 2018-08-09 LAB — BASIC METABOLIC PANEL
Anion gap: 10 (ref 5–15)
BUN: 10 mg/dL (ref 6–20)
CO2: 21 mmol/L — ABNORMAL LOW (ref 22–32)
Calcium: 9.2 mg/dL (ref 8.9–10.3)
Chloride: 107 mmol/L (ref 98–111)
Creatinine, Ser: 1.27 mg/dL — ABNORMAL HIGH (ref 0.61–1.24)
Glucose, Bld: 98 mg/dL (ref 70–99)
POTASSIUM: 4.6 mmol/L (ref 3.5–5.1)
SODIUM: 138 mmol/L (ref 135–145)

## 2018-08-09 LAB — D-DIMER, QUANTITATIVE (NOT AT ARMC): D DIMER QUANT: 0.98 ug{FEU}/mL — AB (ref 0.00–0.50)

## 2018-08-09 LAB — BRAIN NATRIURETIC PEPTIDE: B Natriuretic Peptide: 639.7 pg/mL — ABNORMAL HIGH (ref 0.0–100.0)

## 2018-08-09 LAB — I-STAT TROPONIN, ED: TROPONIN I, POC: 0.04 ng/mL (ref 0.00–0.08)

## 2018-08-09 MED ORDER — HYDROCHLOROTHIAZIDE 25 MG PO TABS
25.0000 mg | ORAL_TABLET | Freq: Every day | ORAL | Status: DC
  Administered 2018-08-09: 25 mg via ORAL
  Filled 2018-08-09: qty 1

## 2018-08-09 MED ORDER — ONDANSETRON HCL 4 MG/2ML IJ SOLN: 4.0000 mg | Freq: Four times a day (QID) | INTRAMUSCULAR | Status: DC | PRN

## 2018-08-09 MED ORDER — FUROSEMIDE 10 MG/ML IJ SOLN
40.0000 mg | Freq: Two times a day (BID) | INTRAMUSCULAR | Status: DC
  Administered 2018-08-10 – 2018-08-11 (×3): 40 mg via INTRAVENOUS
  Filled 2018-08-09 (×3): qty 4

## 2018-08-09 MED ORDER — IOPAMIDOL (ISOVUE-370) INJECTION 76%
INTRAVENOUS | Status: AC
  Filled 2018-08-09: qty 100

## 2018-08-09 MED ORDER — SODIUM CHLORIDE 0.9 % IV SOLN: 250.0000 mL | INTRAVENOUS | Status: DC | PRN

## 2018-08-09 MED ORDER — ENOXAPARIN SODIUM 40 MG/0.4ML ~~LOC~~ SOLN
40.0000 mg | SUBCUTANEOUS | Status: DC
  Administered 2018-08-10 – 2018-08-11 (×2): 40 mg via SUBCUTANEOUS
  Filled 2018-08-09 (×2): qty 0.4

## 2018-08-09 MED ORDER — FUROSEMIDE 10 MG/ML IJ SOLN
40.0000 mg | Freq: Once | INTRAMUSCULAR | Status: AC
  Administered 2018-08-09: 40 mg via INTRAVENOUS
  Filled 2018-08-09: qty 4

## 2018-08-09 MED ORDER — HYDROCORTISONE NA SUCCINATE PF 100 MG IJ SOLR
200.0000 mg | Freq: Once | INTRAMUSCULAR | Status: AC
  Administered 2018-08-09: 200 mg via INTRAVENOUS
  Filled 2018-08-09: qty 4

## 2018-08-09 MED ORDER — DIPHENHYDRAMINE HCL 50 MG/ML IJ SOLN
50.0000 mg | Freq: Once | INTRAMUSCULAR | Status: AC
  Administered 2018-08-09: 50 mg via INTRAVENOUS
  Filled 2018-08-09: qty 1

## 2018-08-09 MED ORDER — SODIUM CHLORIDE 0.9% FLUSH: 3.0000 mL | INTRAVENOUS | Status: DC | PRN

## 2018-08-09 MED ORDER — ACETAMINOPHEN 325 MG PO TABS
650.0000 mg | ORAL_TABLET | ORAL | Status: DC | PRN
  Administered 2018-08-10 (×2): 650 mg via ORAL
  Filled 2018-08-09 (×2): qty 2

## 2018-08-09 MED ORDER — AMLODIPINE BESYLATE 5 MG PO TABS
10.0000 mg | ORAL_TABLET | Freq: Once | ORAL | Status: AC
  Administered 2018-08-09: 10 mg via ORAL
  Filled 2018-08-09: qty 2

## 2018-08-09 MED ORDER — IOPAMIDOL (ISOVUE-370) INJECTION 76%
100.0000 mL | Freq: Once | INTRAVENOUS | Status: AC | PRN
  Administered 2018-08-09: 100 mL via INTRAVENOUS

## 2018-08-09 MED ORDER — SODIUM CHLORIDE 0.9% FLUSH
3.0000 mL | Freq: Two times a day (BID) | INTRAVENOUS | Status: DC
  Administered 2018-08-09 – 2018-08-11 (×4): 3 mL via INTRAVENOUS

## 2018-08-09 MED ORDER — HYDRALAZINE HCL 20 MG/ML IJ SOLN
10.0000 mg | INTRAMUSCULAR | Status: DC | PRN
  Administered 2018-08-09: 10 mg via INTRAVENOUS
  Filled 2018-08-09: qty 1

## 2018-08-09 NOTE — ED Notes (Signed)
Patient transported to X-ray 

## 2018-08-09 NOTE — ED Notes (Signed)
During ambulation, pts SPO2 started at 88% on initial stand and first 1/2 of ambulation.  Pt reported increased shortness of breath and was encouraged to ambulate slower, SPO2 increased to 96-98% for remainder of ambulation.

## 2018-08-09 NOTE — ED Notes (Signed)
Pt given urinal to void

## 2018-08-09 NOTE — ED Triage Notes (Addendum)
Pt states he has had shortness of breath for 4 days with a dry non productive cough. Denies chest pain, denies swelling or weight gain. Pt also has a headache. Pt is hypertensive at triage. Has hx of htn but does not take medication.

## 2018-08-09 NOTE — ED Provider Notes (Signed)
Assumed care from [place].  In brief this is a 52 year old male with a history of hypertension who has been off of his home meds for the past 6 months who presented with 4 days of worsening exertional shortness of breath.  Chest x-ray reveals interstitial edema with mild cardiomegaly.  BNP elevated at 639.  His O2 sat drops to 88% while ambulating in the hallway.  Per chart review patient has never had an echocardiogram.  No prior history of CHF.  He will need admission for likely new onset CHF in the setting of untreated hypertension.  Patient started on 40mg  IV lasix in the ED.  Of note, CT angio chest negative for acute PE.  Dr. Myna Hidalgo with hospitalist service will admit.     Glyn Ade, PA-C 08/10/18 4327    Sherwood Gambler, MD 08/10/18 [phone]

## 2018-08-09 NOTE — ED Notes (Signed)
Please call son with update  [place]

## 2018-08-09 NOTE — ED Notes (Signed)
Student provider at bedside.

## 2018-08-09 NOTE — ED Provider Notes (Signed)
[facility] EMERGENCY DEPARTMENT Provider Note   CSN: [id number] Arrival date & time: 08/09/18  1231     History   Chief Complaint Chief Complaint  Patient presents with  . Shortness of Breath    HPI Curtis Patton is a 52 y.o. male.  The history is provided by the patient. No language interpreter was used.  Shortness of Breath  This is a new problem. The average episode lasts 4 days. The problem occurs continuously.The problem has been gradually worsening. Associated symptoms include cough. He has tried nothing for the symptoms. The treatment provided no relief. He has had no prior hospitalizations.  Pt reports he has not taken his blood pressure medications in 6 months. Pt reports he is short of breath.  Pt reports he is more short of breath with walking.   Past Medical History:  Diagnosis Date  . Hypertension   . Migraine     Patient Active Problem List   Diagnosis Date Noted  . Pilar cyst 04/12/2013    Past Surgical History:  Procedure Laterality Date  . JOINT REPLACEMENT    . neck    . ROTATOR CUFF REPAIR    . SPINE SURGERY          Home Medications    Prior to Admission medications   Medication Sig Start Date End Date Taking? Authorizing Provider  aspirin-acetaminophen-caffeine (EXCEDRIN MIGRAINE) [phone] MG per tablet Take 1 tablet by mouth every 6 (six) hours as needed for headache.    [provider]  clindamycin (CLEOCIN) 300 MG capsule Take 1 capsule (300 mg total) by mouth 3 (three) times daily. 06/12/16   Janne Napoleon, NP  lisinopril-hydrochlorothiazide (PRINZIDE,ZESTORETIC) 20-25 MG per tablet Take 1 tablet by mouth daily.  03/28/13   [provider]  Multiple Vitamin (MULTIVITAMIN WITH MINERALS) TABS tablet Take 1 tablet by mouth daily.    [provider]  naproxen (NAPROSYN) 500 MG tablet Take 1 tablet (500 mg total) by mouth 2 (two) times daily as needed (headache). 09/23/14   Rolland Porter, MD    prochlorperazine (COMPAZINE) 25 MG suppository Place 1 suppository (25 mg total) rectally every 12 (twelve) hours as needed for nausea or vomiting (or headache). 09/23/14   Rolland Porter, MD  silver sulfADIAZINE (SILVADENE) 1 % cream Apply 1 application topically daily. 04/10/16   Janne Napoleon, NP    Family History No family history on file.  Social History Social History   Tobacco Use  . Smoking status: Current Every Day Smoker    Packs/day: 2.00    Types: Cigars  . Smokeless tobacco: Never Used  Substance Use Topics  . Alcohol use: Yes    Comment: rarely  . Drug use: No     Allergies   Contrast media [iodinated diagnostic agents]   Review of Systems Review of Systems  Respiratory: Positive for cough and shortness of breath.   All other systems reviewed and are negative.    Physical Exam Updated Vital Signs BP (!) 179/117   Pulse 76   Temp 97.9 F (36.6 C)   Resp 16   SpO2 98%   Physical Exam  Constitutional: He appears well-developed and well-nourished.  HENT:  Head: Normocephalic.  Mouth/Throat: Oropharynx is clear and moist.  Cardiovascular: Normal rate and regular rhythm.  Pulmonary/Chest: Effort normal and breath sounds normal.  Abdominal: Soft.  Musculoskeletal:       Right lower leg: Normal.  Neurological: He is alert.  Skin: Skin is warm. Capillary  refill takes less than 2 seconds.  Psychiatric: He has a normal mood and affect.  Nursing note and vitals reviewed.    ED Treatments / Results  Labs (all labs ordered are listed, but only abnormal results are displayed) Labs Reviewed  BASIC METABOLIC PANEL - Abnormal; Notable for the following components:      Result Value   CO2 21 (*)    Creatinine, Ser 1.27 (*)    All other components within normal limits  CBC - Abnormal; Notable for the following components:   RBC 6.04 (*)    All other components within normal limits  BRAIN NATRIURETIC PEPTIDE  I-STAT TROPONIN, ED    EKG EKG  Interpretation  Date/Time:  Monday August 09 2018 12:34:36 EDT Ventricular Rate:  96 PR Interval:  166 QRS Duration: 88 QT Interval:  370 QTC Calculation: 467 R Axis:   -33 Text Interpretation:  ** Poor data quality, interpretation may be adversely affected Normal sinus rhythm Left axis deviation Anterior infarct , age undetermined Abnormal ECG nonspecific T waves. No old tracing to compare Confirmed by Sherwood Gambler [phone]) on 08/09/2018 12:47:46 PM Also confirmed by Sherwood Gambler [phone]), editor Hattie Perch [phone])  on 08/09/2018 1:28:17 PM   Radiology Dg Chest 2 View  Result Date: 08/09/2018 CLINICAL DATA:  Chest pain, shortness of breath for 5 days. EXAM: CHEST - 2 VIEW COMPARISON:  None. FINDINGS: Borderline cardiomegaly. Overall cardiomediastinal silhouette is within normal limits. Mildly prominent interstitial markings bilaterally suggesting edema versus chronic interstitial lung disease. No confluent opacity to suggest a consolidating pneumonia. No pleural effusion or pneumothorax seen. No acute or suspicious osseous finding. IMPRESSION: 1. Mildly prominent interstitial markings bilaterally. Without prior films for comparison, this could represent either acute interstitial edema or chronic interstitial lung disease. No evidence of overt alveolar pulmonary edema. No evidence of pneumonia. 2. Borderline cardiomegaly. Electronically Signed   By: Franki Cabot M.D.   On: 08/09/2018 13:58    Procedures Procedures (including critical care time)  Medications Ordered in ED Medications  hydrochlorothiazide (HYDRODIURIL) tablet 25 mg (has no administration in time range)  amLODipine (NORVASC) tablet 10 mg (has no administration in time range)     Initial Impression / Assessment and Plan / ED Course  I have reviewed the triage vital signs and the nursing notes.  Pertinent labs & imaging results that were available during my care of the patient were reviewed by me and  considered in my medical decision making (see chart for details).       Final Clinical Impressions(s) / ED Diagnoses   Final diagnoses:  None    ED Discharge Orders    None       Sidney Ace 08/09/18 1605    Sherwood Gambler, MD 08/10/18 [phone]

## 2018-08-09 NOTE — H&P (Addendum)
History and Physical    CARTRELL BENTSEN [id number] DOB: Aug 26, 1966 DOA: 08/09/2018  PCP: Patient, No Pcp Per   Patient coming from: Home   Chief Complaint: SOB, non-productive cough   HPI: Curtis Patton is a 52 y.o. male with medical history significant for hypertension previously on antihypertensives but not in the past 6 months or more, now presenting to the department for evaluation of shortness of breath and orthopnea.  Patient reports that he was in his usual state until approximately 5 days ago when he noted dyspnea with mild exertion.  Since that time, he has developed persistent shortness of breath, worse with any exertion, lying down, or bending over.  He denies chest pain, fevers, or chills.  He reports a mild nonproductive cough.  Denies leg swelling or tenderness.  Denies any wheezing.  ED Course: Upon arrival to the ED, patient is found to be afebrile, saturating 88% on room air with slight exertion, mildly tachypneic, and hypertensive to 200/140.  EKG features a sinus rhythm with left axis deviation.  Chest x-ray is notable for interstitial prominence suggestive of edema or chronic interstitial disease.  Chemistry panel is notable for a creatinine 1.27, up from 0.9 several years ago.  CBC is unremarkable, troponin is normal, and BNP is elevated to 640.  D-dimer was elevated to 0.98.  CTA chest is negative for PE, but notable for borderline cardiomegaly, vascular congestion, and groundglass opacities suggestive of early interstitial edema.  Patient was given 10 mg Norvasc and 40 mg IV Lasix in the ED.  He remains hemodynamically stable, is not in acute distress, and will be observed for ongoing evaluation and management of suspected new-onset CHF.  Review of Systems:  All other systems reviewed and apart from HPI, are negative.  Past Medical History:  Diagnosis Date  . Hypertension   . Migraine     Past Surgical History:  Procedure Laterality Date  . JOINT REPLACEMENT      . neck    . ROTATOR CUFF REPAIR    . SPINE SURGERY       reports that he has been smoking cigars. He has been smoking about 2.00 packs per day. He has never used smokeless tobacco. He reports that he drinks alcohol. He reports that he does not use drugs.  Allergies  Allergen Reactions  . Contrast Media [Iodinated Diagnostic Agents]     Family History  Problem Relation Age of Onset  . Hypertension Other      Prior to Admission medications   Medication Sig Start Date End Date Taking? Authorizing Provider  aspirin-acetaminophen-caffeine (EXCEDRIN MIGRAINE) [phone] MG per tablet Take 1 tablet by mouth every 6 (six) hours as needed for headache.    [provider]  naproxen (NAPROSYN) 500 MG tablet Take 1 tablet (500 mg total) by mouth 2 (two) times daily as needed (headache). Patient not taking: Reported on 08/09/2018 09/23/14   Rolland Porter, MD    Physical Exam: Vitals:   08/09/18 2100 08/09/18 2305 08/09/18 2306 08/09/18 2310  BP: (!) 160/107 (!) 185/145 (!) 180/145 (!) 164/118  Pulse: 83  94 92  Resp: 15  12 (!) 23  Temp:      SpO2: 96%  94% 94%    Constitutional: NAD, calm  Eyes: PERTLA, lids and conjunctivae normal ENMT: Mucous membranes are moist. Posterior pharynx clear of any exudate or lesions.   Neck: normal, supple, no masses, no thyromegaly Respiratory: Rales bilaterally. Mild dyspnea with speech. No accessory muscle use.  Cardiovascular: S1 & S2 heard, regular rate and rhythm. No extremity edema. JVP not well-visualized. Abdomen: No distension, no tenderness, soft. Bowel sounds active.  Musculoskeletal: no clubbing / cyanosis. No joint deformity upper and lower extremities.   Skin: no significant rashes, lesions, ulcers. Warm, dry, well-perfused. Neurologic: CN 2-12 grossly intact. Sensation intact. Strength 5/5 in all 4 limbs.  Psychiatric: Alert and oriented x 3. Pleasant and cooperative.    Labs on Admission: I have personally reviewed following  labs and imaging studies  CBC: Recent Labs  Lab 08/09/18 1244  WBC 10.1  HGB 16.8  HCT 49.1  MCV 81.3  PLT 716   Basic Metabolic Panel: Recent Labs  Lab 08/09/18 1244  NA 138  K 4.6  CL 107  CO2 21*  GLUCOSE 98  BUN 10  CREATININE 1.27*  CALCIUM 9.2   GFR: CrCl cannot be calculated (Unknown ideal weight.). Liver Function Tests: No results for input(s): AST, ALT, ALKPHOS, BILITOT, PROT, ALBUMIN in the last 168 hours. No results for input(s): LIPASE, AMYLASE in the last 168 hours. No results for input(s): AMMONIA in the last 168 hours. Coagulation Profile: No results for input(s): INR, PROTIME in the last 168 hours. Cardiac Enzymes: No results for input(s): CKTOTAL, CKMB, CKMBINDEX, TROPONINI in the last 168 hours. BNP (last 3 results) No results for input(s): PROBNP in the last 8760 hours. HbA1C: No results for input(s): HGBA1C in the last 72 hours. CBG: No results for input(s): GLUCAP in the last 168 hours. Lipid Profile: No results for input(s): CHOL, HDL, LDLCALC, TRIG, CHOLHDL, LDLDIRECT in the last 72 hours. Thyroid Function Tests: No results for input(s): TSH, T4TOTAL, FREET4, T3FREE, THYROIDAB in the last 72 hours. Anemia Panel: No results for input(s): VITAMINB12, FOLATE, FERRITIN, TIBC, IRON, RETICCTPCT in the last 72 hours. Urine analysis: No results found for: COLORURINE, APPEARANCEUR, LABSPEC, PHURINE, GLUCOSEU, HGBUR, BILIRUBINUR, KETONESUR, PROTEINUR, UROBILINOGEN, NITRITE, LEUKOCYTESUR Sepsis Labs: @LABRCNTIP (procalcitonin:4,lacticidven:4) )No results found for this or any previous visit (from the past 240 hour(s)).   Radiological Exams on Admission: Dg Chest 2 View  Result Date: 08/09/2018 CLINICAL DATA:  Chest pain, shortness of breath for 5 days. EXAM: CHEST - 2 VIEW COMPARISON:  None. FINDINGS: Borderline cardiomegaly. Overall cardiomediastinal silhouette is within normal limits. Mildly prominent interstitial markings bilaterally suggesting  edema versus chronic interstitial lung disease. No confluent opacity to suggest a consolidating pneumonia. No pleural effusion or pneumothorax seen. No acute or suspicious osseous finding. IMPRESSION: 1. Mildly prominent interstitial markings bilaterally. Without prior films for comparison, this could represent either acute interstitial edema or chronic interstitial lung disease. No evidence of overt alveolar pulmonary edema. No evidence of pneumonia. 2. Borderline cardiomegaly. Electronically Signed   By: Franki Cabot M.D.   On: 08/09/2018 13:58   Ct Angio Chest Pe W And/or Wo Contrast  Result Date: 08/09/2018 CLINICAL DATA:  Shortness of breath, cough EXAM: CT ANGIOGRAPHY CHEST WITH CONTRAST TECHNIQUE: Multidetector CT imaging of the chest was performed using the standard protocol during bolus administration of intravenous contrast. Multiplanar CT image reconstructions and MIPs were obtained to evaluate the vascular anatomy. CONTRAST:  167mL ISOVUE-370 IOPAMIDOL (ISOVUE-370) INJECTION 76% COMPARISON:  Chest CT earlier today FINDINGS: Cardiovascular: Heart is mildly enlarged. Aorta is normal caliber. No filling defects in the pulmonary arteries to suggest pulmonary emboli. Mediastinum/Nodes: No mediastinal, hilar, or axillary adenopathy. Lungs/Pleura: Mild vascular congestion. Ground-glass opacities in the lower lungs could reflect early edema or atelectasis. Trace bilateral effusions. Upper Abdomen: Imaging into the upper abdomen shows  no acute findings. Musculoskeletal: Chest wall soft tissues are unremarkable. No acute bony abnormality. Review of the MIP images confirms the above findings. IMPRESSION: No evidence of pulmonary embolus. Borderline cardiomegaly. Vascular congestion. Ground-glass opacities in the lower lobes could reflect early interstitial edema or atelectasis. Trace bilateral effusions. Electronically Signed   By: Rolm Baptise M.D.   On: 08/09/2018 21:32    EKG: Independently reviewed.  Sinus rhythm, LAD.   Assessment/Plan   1. Acute CHF  - Presents with several days of SOB with orthopnea  - He is found to have elevated BNP, no significant peripheral edema but rales on exam   - Treated with 40 mg IV Lasix in ED  - Continue diuresis with Lasix 40 mg IV q12h, check echocardiogram, follow daily wt and I/O's    2. Hypertension with hypertensive urgency  - Pt reports hx of HTN, previously on antihypertensives but not in the last 6 mos or more  - Anticipate improvement with diuresis  - Use hydralazine IVP's as needed, consider beta-blocker pending echo findings, and ACE/ARB if renal function improves or remains stable    3. Mild renal insufficiency  - SCr is 1.27 on admission, uncertain chronicity, up from 0.9 on remote prior  - Follow daily chem panel while diuresing in hospital     DVT prophylaxis: Lovenox Code Status: Full  Family Communication: Discussed with patient  Consults called: None Admission status: Observation     Vianne Bulls, MD [facility] Pager [phone]  If 7PM-7AM, please contact night-coverage [email or web address] Password [email or web address]  08/09/2018, 11:19 PM

## 2018-08-10 ENCOUNTER — Other Ambulatory Visit: Payer: Self-pay

## 2018-08-10 ENCOUNTER — Encounter (HOSPITAL_COMMUNITY): Payer: Self-pay | Admitting: Internal Medicine

## 2018-08-10 ENCOUNTER — Observation Stay (HOSPITAL_COMMUNITY): Payer: Commercial Managed Care - PPO

## 2018-08-10 DIAGNOSIS — R0602 Shortness of breath: Secondary | ICD-10-CM | POA: Diagnosis present

## 2018-08-10 DIAGNOSIS — Z791 Long term (current) use of non-steroidal anti-inflammatories (NSAID): Secondary | ICD-10-CM | POA: Diagnosis not present

## 2018-08-10 DIAGNOSIS — Z23 Encounter for immunization: Secondary | ICD-10-CM | POA: Diagnosis not present

## 2018-08-10 DIAGNOSIS — I5033 Acute on chronic diastolic (congestive) heart failure: Secondary | ICD-10-CM | POA: Diagnosis present

## 2018-08-10 DIAGNOSIS — N183 Chronic kidney disease, stage 3 (moderate): Secondary | ICD-10-CM | POA: Diagnosis present

## 2018-08-10 DIAGNOSIS — E669 Obesity, unspecified: Secondary | ICD-10-CM | POA: Diagnosis present

## 2018-08-10 DIAGNOSIS — I169 Hypertensive crisis, unspecified: Secondary | ICD-10-CM

## 2018-08-10 DIAGNOSIS — F17290 Nicotine dependence, other tobacco product, uncomplicated: Secondary | ICD-10-CM | POA: Diagnosis present

## 2018-08-10 DIAGNOSIS — Z8249 Family history of ischemic heart disease and other diseases of the circulatory system: Secondary | ICD-10-CM | POA: Diagnosis not present

## 2018-08-10 DIAGNOSIS — Z91041 Radiographic dye allergy status: Secondary | ICD-10-CM | POA: Diagnosis not present

## 2018-08-10 DIAGNOSIS — I130 Hypertensive heart and chronic kidney disease with heart failure and stage 1 through stage 4 chronic kidney disease, or unspecified chronic kidney disease: Secondary | ICD-10-CM | POA: Diagnosis present

## 2018-08-10 DIAGNOSIS — I5031 Acute diastolic (congestive) heart failure: Secondary | ICD-10-CM

## 2018-08-10 DIAGNOSIS — J9601 Acute respiratory failure with hypoxia: Secondary | ICD-10-CM | POA: Diagnosis present

## 2018-08-10 DIAGNOSIS — I160 Hypertensive urgency: Secondary | ICD-10-CM | POA: Diagnosis present

## 2018-08-10 DIAGNOSIS — E876 Hypokalemia: Secondary | ICD-10-CM | POA: Diagnosis present

## 2018-08-10 DIAGNOSIS — Z79899 Other long term (current) drug therapy: Secondary | ICD-10-CM | POA: Diagnosis not present

## 2018-08-10 DIAGNOSIS — I5030 Unspecified diastolic (congestive) heart failure: Secondary | ICD-10-CM

## 2018-08-10 DIAGNOSIS — Z9119 Patient's noncompliance with other medical treatment and regimen: Secondary | ICD-10-CM | POA: Diagnosis not present

## 2018-08-10 DIAGNOSIS — Z6832 Body mass index (BMI) 32.0-32.9, adult: Secondary | ICD-10-CM | POA: Diagnosis not present

## 2018-08-10 DIAGNOSIS — Z7982 Long term (current) use of aspirin: Secondary | ICD-10-CM | POA: Diagnosis not present

## 2018-08-10 LAB — ECHOCARDIOGRAM COMPLETE: WEIGHTICAEL: 4049.6 [oz_av]

## 2018-08-10 LAB — BASIC METABOLIC PANEL
Anion gap: 10 (ref 5–15)
BUN: 13 mg/dL (ref 6–20)
CALCIUM: 9.6 mg/dL (ref 8.9–10.3)
CO2: 24 mmol/L (ref 22–32)
CREATININE: 1.25 mg/dL — AB (ref 0.61–1.24)
Chloride: 102 mmol/L (ref 98–111)
Glucose, Bld: 103 mg/dL — ABNORMAL HIGH (ref 70–99)
Potassium: 3.5 mmol/L (ref 3.5–5.1)
SODIUM: 136 mmol/L (ref 135–145)

## 2018-08-10 LAB — HIV ANTIBODY (ROUTINE TESTING W REFLEX): HIV Screen 4th Generation wRfx: NONREACTIVE

## 2018-08-10 MED ORDER — AMLODIPINE BESYLATE 10 MG PO TABS
10.0000 mg | ORAL_TABLET | Freq: Every day | ORAL | Status: DC
  Administered 2018-08-10 – 2018-08-11 (×2): 10 mg via ORAL
  Filled 2018-08-10 (×3): qty 1

## 2018-08-10 MED ORDER — INFLUENZA VAC SPLIT QUAD 0.5 ML IM SUSY
0.5000 mL | PREFILLED_SYRINGE | INTRAMUSCULAR | Status: AC
  Administered 2018-08-11: 0.5 mL via INTRAMUSCULAR
  Filled 2018-08-10: qty 0.5

## 2018-08-10 NOTE — Progress Notes (Deleted)
[facility] PROGRESS NOTE  RED MANDT NKN:[id number] DOB: 1966/06/19 DOA: 08/09/2018 PCP: Patient, No Pcp Per  Assessment/Plan:  Acute respiratory failure with hypoxia secondary to acute CHF in setting of hypertensive urgency related to non-complianc -oxygen saturation level 88% with ambulation in ED. Chest xray with borderline cardiomegaly and mildly prominent interstitial markings bilaterally. CT angio chest negtative for PE, mild vascular congestion, trace bilateral effusions. This am weight down volume -2.4L, oxygen saturation level 99% on room air at rest -continue lasix IV -continue BP control -ambulate and monitor oxygen saturation level   Acute CHF likely diastolic in setting of uncontrolled BP due to non-compliance - Presented with several days of SOB with orthopnea. Provided with IV lasix. Much improved this am. Still DOE   - continue with 40 mg IV Lasix  - follow echocardiogram, follow daily wt and I/O's   -cardiology consult requested -ambulate as noted above  2. Hypertension with hypertensive urgency  - Pt reports hx of HTN, previously took Prinzide according to chart. Not taking for 6 months due to no insurance. BP better controlled this am with norvasc, IV lasix and prn hydralazine.  -continue current regimin. consider beta-blocker pending echo findings. Renal function stable at 1.2 so ACE/ARB may be possibility. -follow echo -await cards recommendations    3. Mild renal insufficiency  - SCr is 1.27 on admission and 1.2 today. uncertain chronicity, up from 0.9 on remote prior  - Follow daily chem panel while diuresing in hospital     Code Status: full Family Communication: none present Disposition Plan: home hopefully in am   Consultants:  cardmaster  Procedures:  echo  Antibiotics:  none  HPI/Subjective: Curtis Patton is a 52 y.o. male with medical history significant for hypertension previously on antihypertensives but not in the past 6  months or more, presented 10/21 for evaluation of shortness of breath and orthopnea.  Patient reported that he was in his usual state until approximately 5 days prior when he noted dyspnea with mild exertion.  He then developed persistent shortness of breath, worse with any exertion, lying down, or bending over.  He denied chest pain, fevers, or chills.  He reported a mild nonproductive cough.  Denied leg swelling or tenderness.  Denied any wheezing.  Objective: Vitals:   08/09/18 2333 08/10/18 0556  BP: (!) 151/107 140/83  Pulse: 99 87  Resp: 18 18  Temp: 98.2 F (36.8 C) 97.8 F (36.6 C)  SpO2: 97% 98%    Intake/Output Summary (Last 24 hours) at 08/10/2018 0908 Last data filed at 08/10/2018 0835 Gross per 24 hour  Intake 760 ml  Output 3200 ml  Net -2440 ml   Filed Weights   08/09/18 2333 08/10/18 0556  Weight: 116.5 kg 114.8 kg    Exam:   General:  Sitting up in bed watching TV in no acute distress  Cardiovascular: rrr no MGR no LE edema  Respiratory: no increased work of breathing. BS clear bilaterally no crackles or wheezes  Abdomen: obese soft +BS no guarding or rebounding  Musculoskeletal: joints without swelling/erythema  Data Reviewed: Basic Metabolic Panel: Recent Labs  Lab 08/09/18 1244 08/10/18 0524  NA 138 136  K 4.6 3.5  CL 107 102  CO2 21* 24  GLUCOSE 98 103*  BUN 10 13  CREATININE 1.27* 1.25*  CALCIUM 9.2 9.6   Liver Function Tests: No results for input(s): AST, ALT, ALKPHOS, BILITOT, PROT, ALBUMIN in the last 168 hours. No results for input(s): LIPASE, AMYLASE in  the last 168 hours. No results for input(s): AMMONIA in the last 168 hours. CBC: Recent Labs  Lab 08/09/18 1244  WBC 10.1  HGB 16.8  HCT 49.1  MCV 81.3  PLT 241   Cardiac Enzymes: No results for input(s): CKTOTAL, CKMB, CKMBINDEX, TROPONINI in the last 168 hours. BNP (last 3 results) Recent Labs    08/09/18 1244  BNP 639.7*    ProBNP (last 3 results) No results  for input(s): PROBNP in the last 8760 hours.  CBG: No results for input(s): GLUCAP in the last 168 hours.  No results found for this or any previous visit (from the past 240 hour(s)).   Studies: Dg Chest 2 View  Result Date: 08/09/2018 CLINICAL DATA:  Chest pain, shortness of breath for 5 days. EXAM: CHEST - 2 VIEW COMPARISON:  None. FINDINGS: Borderline cardiomegaly. Overall cardiomediastinal silhouette is within normal limits. Mildly prominent interstitial markings bilaterally suggesting edema versus chronic interstitial lung disease. No confluent opacity to suggest a consolidating pneumonia. No pleural effusion or pneumothorax seen. No acute or suspicious osseous finding. IMPRESSION: 1. Mildly prominent interstitial markings bilaterally. Without prior films for comparison, this could represent either acute interstitial edema or chronic interstitial lung disease. No evidence of overt alveolar pulmonary edema. No evidence of pneumonia. 2. Borderline cardiomegaly. Electronically Signed   By: Franki Cabot M.D.   On: 08/09/2018 13:58   Ct Angio Chest Pe W And/or Wo Contrast  Result Date: 08/09/2018 CLINICAL DATA:  Shortness of breath, cough EXAM: CT ANGIOGRAPHY CHEST WITH CONTRAST TECHNIQUE: Multidetector CT imaging of the chest was performed using the standard protocol during bolus administration of intravenous contrast. Multiplanar CT image reconstructions and MIPs were obtained to evaluate the vascular anatomy. CONTRAST:  154mL ISOVUE-370 IOPAMIDOL (ISOVUE-370) INJECTION 76% COMPARISON:  Chest CT earlier today FINDINGS: Cardiovascular: Heart is mildly enlarged. Aorta is normal caliber. No filling defects in the pulmonary arteries to suggest pulmonary emboli. Mediastinum/Nodes: No mediastinal, hilar, or axillary adenopathy. Lungs/Pleura: Mild vascular congestion. Ground-glass opacities in the lower lungs could reflect early edema or atelectasis. Trace bilateral effusions. Upper Abdomen: Imaging into  the upper abdomen shows no acute findings. Musculoskeletal: Chest wall soft tissues are unremarkable. No acute bony abnormality. Review of the MIP images confirms the above findings. IMPRESSION: No evidence of pulmonary embolus. Borderline cardiomegaly. Vascular congestion. Ground-glass opacities in the lower lobes could reflect early interstitial edema or atelectasis. Trace bilateral effusions. Electronically Signed   By: Rolm Baptise M.D.   On: 08/09/2018 21:32    Scheduled Meds: . enoxaparin (LOVENOX) injection  40 mg Subcutaneous Q24H  . furosemide  40 mg Intravenous Q12H  . [START ON 08/11/2018] Influenza vac split quadrivalent PF  0.5 mL Intramuscular Tomorrow-1000  . iopamidol      . sodium chloride flush  3 mL Intravenous Q12H   Continuous Infusions: . sodium chloride      Principal Problem:   Acute respiratory failure with hypoxia (HCC) Active Problems:   Acute CHF (congestive heart failure) (HCC)   Hypertension   Hypertensive urgency   Mild renal insufficiency    Time spent: 35 minutes    [facility]  If 7PM-7AM, please contact night-coverage at [email or web address], password [facility] 08/10/2018, 9:08 AM  LOS: 0 days

## 2018-08-10 NOTE — Progress Notes (Signed)
  Echocardiogram 2D Echocardiogram has been performed.  Darlina Sicilian M 08/10/2018, 10:39 AM

## 2018-08-10 NOTE — Care Management Note (Addendum)
Case Management Note  Patient Details  Name: Curtis Patton MRN: [id number] Date of Birth: Mar 08, 1966  Subjective/Objective:     CHF              Action/Plan: Patient lives at home; PCP is Dr Grier Mitts (new); has private insurance with [facility] with prescription drug coverage; pharmacy of choice is [facility] on [address]; pt reports no problem getting his medication; he is independent of all of his ADL's; works as a Training and development officer at the [employer]; he states that he does not use salt at home and does not eat out a lot; he has scales and was encouraged to weigh himself daily; CM will continue to follow for progression of care.  Expected Discharge Date:   possibly 08/04/2018               Expected Discharge Plan:  [place]  Discharge planning Services  CM Consult  Choice offered to:  Patient  Status of Service:  In process, will continue to follow  Sherrilyn Rist [phone] 08/10/2018, 2:15 PM

## 2018-08-10 NOTE — H&P (Signed)
History and Physical    Curtis Patton:[id number] DOB: August 30, 1966 DOA: 08/09/2018  PCP: Patient, No Pcp Per Patient coming from: home  Chief Complaint: DOE  HPI Curtis Patton a 51 y.o.malewith medical history significant forhypertension previously on antihypertensives but not in the past 6 months or more, presented 10/21 for evaluation of shortness of breath and orthopnea. Patient reported that he was in his usual state until approximately 5 days prior when he noted dyspnea with mild exertion. He then developed persistent shortness of breath, worse with any exertion, lying down, or bending over. He denied chest pain, fevers, or chills. He reported a mild nonproductive cough. Denied leg swelling or tenderness. Denied any wheezing.  ED Course: Upon arrival to the ED, patient is found to be afebrile, saturating 88% on room air with slight exertion, mildly tachypneic, and hypertensive to 200/140.  EKG features a sinus rhythm with left axis deviation.  Chest x-ray is notable for interstitial prominence suggestive of edema or chronic interstitial disease.  Chemistry panel is notable for a creatinine 1.27, up from 0.9 several years ago.  CBC is unremarkable, troponin is normal, and BNP is elevated to 640.  D-dimer was elevated to 0.98.  CTA chest is negative for PE, but notable for borderline cardiomegaly, vascular congestion, and groundglass opacities suggestive of early interstitial edema.  Patient was given 10 mg Norvasc and 40 mg IV Lasix in the ED.  He remains hemodynamically stable, is not in acute distress, and will be observed for ongoing evaluation and management of suspected new-onset CHF  Review of Systems: As per HPI otherwise all other systems reviewed and are negative.   Ambulatory Status: ambulates independantly  Past Medical History:  Diagnosis Date  . Acute CHF ([place])   . Acute renal insufficiency   . Acute respiratory failure with hypoxia ([place])   . Hypertension     . Migraine     Past Surgical History:  Procedure Laterality Date  . JOINT REPLACEMENT    . neck    . ROTATOR CUFF REPAIR    . SPINE SURGERY      Social History   Socioeconomic History  . Marital status: Married    Spouse name: Not on file  . Number of children: Not on file  . Years of education: Not on file  . Highest education level: Not on file  Occupational History  . Not on file  Social Needs  . Financial resource strain: Not on file  . Food insecurity:    Worry: Not on file    Inability: Not on file  . Transportation needs:    Medical: Not on file    Non-medical: Not on file  Tobacco Use  . Smoking status: Current Every Day Smoker    Packs/day: 2.00    Types: Cigars  . Smokeless tobacco: Never Used  Substance and Sexual Activity  . Alcohol use: Yes    Comment: rarely  . Drug use: No  . Sexual activity: Not Currently    Partners: Female  Lifestyle  . Physical activity:    Days per week: Not on file    Minutes per session: Not on file  . Stress: Not on file  Relationships  . Social connections:    Talks on phone: Not on file    Gets together: Not on file    Attends religious service: Not on file    Active member of club or organization: Not on file    Attends meetings of clubs  or organizations: Not on file    Relationship status: Not on file  . Intimate partner violence:    Fear of current or ex partner: Not on file    Emotionally abused: Not on file    Physically abused: Not on file    Forced sexual activity: Not on file  Other Topics Concern  . Not on file  Social History Narrative  . Not on file    Allergies  Allergen Reactions  . Contrast Media [Iodinated Diagnostic Agents]     Family History  Problem Relation Age of Onset  . Hypertension Other     Prior to Admission medications   Medication Sig Start Date End Date Taking? Authorizing Provider  aspirin-acetaminophen-caffeine (EXCEDRIN MIGRAINE) [phone] MG per tablet Take 1 tablet  by mouth every 6 (six) hours as needed for headache.    [provider]  naproxen (NAPROSYN) 500 MG tablet Take 1 tablet (500 mg total) by mouth 2 (two) times daily as needed (headache). Patient not taking: Reported on 08/09/2018 09/23/14   Rolland Porter, MD    Physical Exam: Vitals:   08/09/18 2306 08/09/18 2310 08/09/18 2333 08/10/18 0556  BP: (!) 180/145 (!) 164/118 (!) 151/107 140/83  Pulse: 94 92 99 87  Resp: 12 (!) 23 18 18   Temp:   98.2 F (36.8 C) 97.8 F (36.6 C)  TempSrc:   Oral Oral  SpO2: 94% 94% 97% 98%  Weight:   116.5 kg 114.8 kg     General:  Appears calm and comfortable Eyes:  PERRL, EOMI, normal lids, iris ENT:  grossly normal hearing, lips & tongue, mmm Neck:  no LAD, masses or thyromegaly Cardiovascular:  RRR, no m/r/g. No LE edema.  Respiratory:  No increased wob BS clear bilaterally no wheeze Abdomen:  soft, ntnd, +BS no guarding or rebounding Skin:  no rash or induration seen on limited exam Musculoskeletal:  grossly normal tone BUE/BLE, good ROM, no bony abnormality Psychiatric:  grossly normal mood and affect, speech fluent and appropriate, AOx3 Neurologic:  CN 2-12 grossly intact, moves all extremities in coordinated fashion, sensation intact  Labs on Admission: I have personally reviewed following labs and imaging studies  CBC: Recent Labs  Lab 08/09/18 1244  WBC 10.1  HGB 16.8  HCT 49.1  MCV 81.3  PLT 812   Basic Metabolic Panel: Recent Labs  Lab 08/09/18 1244 08/10/18 0524  NA 138 136  K 4.6 3.5  CL 107 102  CO2 21* 24  GLUCOSE 98 103*  BUN 10 13  CREATININE 1.27* 1.25*  CALCIUM 9.2 9.6   GFR: CrCl cannot be calculated (Unknown ideal weight.). Liver Function Tests: No results for input(s): AST, ALT, ALKPHOS, BILITOT, PROT, ALBUMIN in the last 168 hours. No results for input(s): LIPASE, AMYLASE in the last 168 hours. No results for input(s): AMMONIA in the last 168 hours. Coagulation Profile: No results for input(s):  INR, PROTIME in the last 168 hours. Cardiac Enzymes: No results for input(s): CKTOTAL, CKMB, CKMBINDEX, TROPONINI in the last 168 hours. BNP (last 3 results) No results for input(s): PROBNP in the last 8760 hours. HbA1C: No results for input(s): HGBA1C in the last 72 hours. CBG: No results for input(s): GLUCAP in the last 168 hours. Lipid Profile: No results for input(s): CHOL, HDL, LDLCALC, TRIG, CHOLHDL, LDLDIRECT in the last 72 hours. Thyroid Function Tests: No results for input(s): TSH, T4TOTAL, FREET4, T3FREE, THYROIDAB in the last 72 hours. Anemia Panel: No results for input(s): VITAMINB12, FOLATE, FERRITIN, TIBC,  IRON, RETICCTPCT in the last 72 hours. Urine analysis: No results found for: COLORURINE, APPEARANCEUR, LABSPEC, PHURINE, GLUCOSEU, HGBUR, BILIRUBINUR, KETONESUR, PROTEINUR, UROBILINOGEN, NITRITE, LEUKOCYTESUR  Creatinine Clearance: CrCl cannot be calculated (Unknown ideal weight.).  Sepsis Labs: @LABRCNTIP (procalcitonin:4,lacticidven:4) )No results found for this or any previous visit (from the past 240 hour(s)).   Radiological Exams on Admission: Dg Chest 2 View  Result Date: 08/09/2018 CLINICAL DATA:  Chest pain, shortness of breath for 5 days. EXAM: CHEST - 2 VIEW COMPARISON:  None. FINDINGS: Borderline cardiomegaly. Overall cardiomediastinal silhouette is within normal limits. Mildly prominent interstitial markings bilaterally suggesting edema versus chronic interstitial lung disease. No confluent opacity to suggest a consolidating pneumonia. No pleural effusion or pneumothorax seen. No acute or suspicious osseous finding. IMPRESSION: 1. Mildly prominent interstitial markings bilaterally. Without prior films for comparison, this could represent either acute interstitial edema or chronic interstitial lung disease. No evidence of overt alveolar pulmonary edema. No evidence of pneumonia. 2. Borderline cardiomegaly. Electronically Signed   By: Franki Cabot M.D.   On:  08/09/2018 13:58   Ct Angio Chest Pe W And/or Wo Contrast  Result Date: 08/09/2018 CLINICAL DATA:  Shortness of breath, cough EXAM: CT ANGIOGRAPHY CHEST WITH CONTRAST TECHNIQUE: Multidetector CT imaging of the chest was performed using the standard protocol during bolus administration of intravenous contrast. Multiplanar CT image reconstructions and MIPs were obtained to evaluate the vascular anatomy. CONTRAST:  160mL ISOVUE-370 IOPAMIDOL (ISOVUE-370) INJECTION 76% COMPARISON:  Chest CT earlier today FINDINGS: Cardiovascular: Heart is mildly enlarged. Aorta is normal caliber. No filling defects in the pulmonary arteries to suggest pulmonary emboli. Mediastinum/Nodes: No mediastinal, hilar, or axillary adenopathy. Lungs/Pleura: Mild vascular congestion. Ground-glass opacities in the lower lungs could reflect early edema or atelectasis. Trace bilateral effusions. Upper Abdomen: Imaging into the upper abdomen shows no acute findings. Musculoskeletal: Chest wall soft tissues are unremarkable. No acute bony abnormality. Review of the MIP images confirms the above findings. IMPRESSION: No evidence of pulmonary embolus. Borderline cardiomegaly. Vascular congestion. Ground-glass opacities in the lower lobes could reflect early interstitial edema or atelectasis. Trace bilateral effusions. Electronically Signed   By: Rolm Baptise M.D.   On: 08/09/2018 21:32    EKG: Independently reviewed. Normal sinus rhythm Left axis deviation Anterior infarct , age undetermined Abnormal ECG nonspecific T waves  Assessment/Plan Principal Problem:   Acute respiratory failure with hypoxia (HCC) Active Problems:   Acute CHF (congestive heart failure) (HCC)   Hypertension   Hypertensive urgency   Mild renal insufficiency   Acute on chronic diastolic CHF (congestive heart failure) (HCC)   Acute respiratory failure with hypoxia secondary to acute CHF in setting of hypertensive urgency related to non-complianc -oxygen  saturation level 88% with ambulation in ED. Chest xray with borderline cardiomegaly and mildly prominent interstitial markings bilaterally. CT angio chest negtative for PE, mild vascular congestion, trace bilateral effusions. This am weight down volume -2.4L, oxygen saturation level 99% on room air at rest -continue lasix IV -continue BP control -ambulate and monitor oxygen saturation level  Acute CHFlikely diastolic in setting of uncontrolled BP due to non-compliance -Presented with several days of SOB with orthopnea. Provided with IV lasix. Much improved this am. Still DOE -continue with 40 mg IV Lasix  -follow echocardiogram, follow daily wt and I/O's -cardiology consult requested -ambulate as noted above  2.Hypertension with hypertensive urgency -Pt reports hx of HTN, previously took Prinzide according to chart. Not taking for 6 months due to no insurance. BP better controlled this am with norvasc,  IV lasix and prn hydralazine. -continue current regimin. consider beta-blocker pending echo findings. Renal function stable at 1.2 so ACE/ARB may be possibility. -follow echo -await cards recommendations  3.Mild renal insufficiency -SCr is 1.27 on admission and 1.2 today. uncertain chronicity, up from 0.9 on remote prior -Follow daily chem panel while diuresing in hospital    DVT prophylaxis: lovenox Code Status: full  Family Communication: none present  Disposition Plan: home   Consults called: cardmaster  Admission status: inpatient    Radene Gunning MD [facility]  If 7PM-7AM, please contact night-coverage [email or web address] Password [facility]  08/10/2018, 10:51 AM

## 2018-08-10 NOTE — Consult Note (Addendum)
Cardiology Consult    Patient ID: TAYLEN OSORTO MRN: [id number], DOB/AGE: 22-Oct-1965   Admit date: 08/09/2018 Date of Consult: 08/10/2018  Primary Physician: No PCP Listed in System Primary Cardiologist: New Consult  Requesting Provider: Dr. Evangeline Gula  Patient Profile    Curtis Patton is a 52 y.o. African-American male with a history of hypertension, who is being seen today for the evaluation of acute congestive heart failure at the request of Dr. Evangeline Gula.  History of Present Illness    Curtis Patton is a 52 y.o. African-American male with a history of hypertension. Patient has no known CAD and has never seen a Cardiologist. He was previously on antihypertension medications but lost his insurance and has not been taking anything over the last 6 months.   Patient denies any recent illness. He was in his usual state of health until 5 days ago when he started noticing some shortness of breath while working. He works as a Training and development officer and states he started having to sit down and catch his breath during work. He initially thought he was coming down with a respiratory illness but denies any nasal congestion or cough.  He reports some lightheadedness/dizziness, palpitations, and vision changes with the shortness of breath at time times. He denies any associated chest pain. His shortness of breath worsened to the point where it interfered with his ability to sleep two nights ago on 08/08/2018. He reports orthopnea and PND. He scheduled an doctor's appointment yesterday (08/09/2018) and they told him to come to the ED for his severe hypertension and respiratory distress.    Upon arrival to the ED, patient mildly tachypneic with O2 sats of 88% on room air with slight exertion and hypertensive with BP of 202/141. EKG showed normal sinus rhythm with no acute ischemic changes. I-stat troponin negative. BNP elevated at 639.7. Chest x-ray showed borderline cardiomegaly with mildly prominent interstitial markings  bilaterally. WBC 10.1, Hgb 6.04, Plts 241. Na 138, K 4.6, Glucose 98, SCr 1.27. D-dimer elevated at 0.98. CTA chest showed no evidence of pulmonary embolus but did show borderline cardiomegaly with vascular congestion and ground-glass opacities in lower lobes that could reflect early interstitial edema. Patient was given Amlodipine 10mg  and IV Lasix 40mg  in the ED. Patient admitted for management of suspected new-onset CHF.  Currently, patient still notes some shortness of breath with slight exertion but states his breathing has improved significantly. He feels like he is almost back to normal. He also notes a mild headache earlier this morning that improved after he ate. Patient denies any current chest pain and has no history of exertional chest pain or dyspnea prior to this.   Patient is a former tobacco user. He previously smoked for over 25 years and reports smoking at most 1 pack per day when he was at his worse but reports quitting several years ago. He also reports drinking 1 Martini every day. He   Past Medical History   Past Medical History:  Diagnosis Date  . Acute CHF ([place])   . Acute renal insufficiency   . Acute respiratory failure with hypoxia ([place])   . Hypertension   . Migraine     Past Surgical History:  Procedure Laterality Date  . JOINT REPLACEMENT    . neck    . ROTATOR CUFF REPAIR    . SPINE SURGERY       Allergies  Allergies  Allergen Reactions  . Contrast Media [Iodinated Diagnostic Agents]     Inpatient Medications    .  enoxaparin (LOVENOX) injection  40 mg Subcutaneous Q24H  . furosemide  40 mg Intravenous Q12H  . [START ON 08/11/2018] Influenza vac split quadrivalent PF  0.5 mL Intramuscular Tomorrow-1000  . sodium chloride flush  3 mL Intravenous Q12H    Family History    Family History  Problem Relation Age of Onset  . Hypertension Other   . Hypertension Maternal Grandmother   . Sudden death Paternal 63        Died suddenly in her  mid-50s. Unsure of cause.   He indicated that the status of his maternal grandmother is unknown. He indicated that the status of his paternal grandmother is unknown. He indicated that the status of his other is unknown.   Social History    Social History   Socioeconomic History  . Marital status: Married    Spouse name: Not on file  . Number of children: Not on file  . Years of education: Not on file  . Highest education level: Not on file  Occupational History  . Not on file  Social Needs  . Financial resource strain: Not on file  . Food insecurity:    Worry: Not on file    Inability: Not on file  . Transportation needs:    Medical: Not on file    Non-medical: Not on file  Tobacco Use  . Smoking status: Current Every Day Smoker    Packs/day: 2.00    Types: Cigars  . Smokeless tobacco: Never Used  Substance and Sexual Activity  . Alcohol use: Yes    Comment: rarely  . Drug use: No  . Sexual activity: Not Currently    Partners: Female  Lifestyle  . Physical activity:    Days per week: Not on file    Minutes per session: Not on file  . Stress: Not on file  Relationships  . Social connections:    Talks on phone: Not on file    Gets together: Not on file    Attends religious service: Not on file    Active member of club or organization: Not on file    Attends meetings of clubs or organizations: Not on file    Relationship status: Not on file  . Intimate partner violence:    Fear of current or ex partner: Not on file    Emotionally abused: Not on file    Physically abused: Not on file    Forced sexual activity: Not on file  Other Topics Concern  . Not on file  Social History Narrative  . Not on file     Review of Systems    Review of Systems  Constitutional: Positive for malaise/fatigue. Negative for chills, diaphoresis, fever and weight loss.  HENT: Negative for congestion and sore throat.   Eyes: Positive for blurred vision.  Respiratory: Positive for  shortness of breath. Negative for cough, hemoptysis and sputum production.   Cardiovascular: Positive for palpitations, orthopnea and PND. Negative for chest pain and leg swelling.  Gastrointestinal: Negative for abdominal pain, blood in stool, constipation, nausea and vomiting.  Genitourinary: Negative for hematuria.  Musculoskeletal: Negative for myalgias.  Neurological: Positive for dizziness and headaches. Negative for loss of consciousness.  Endo/Heme/Allergies: Does not bruise/bleed easily.  Psychiatric/Behavioral: Negative for substance abuse (former tobacco use).    Physical Exam    Blood pressure (!) 147/108, pulse 88, temperature 97.6 F (36.4 C), temperature source Oral, resp. rate 18, weight 114.8 kg, SpO2 93 %.  General: 52 y.o. African-American male  resting comfortably in no acute distress. Pleasant and cooperative.  HEENT: Normal.   Neck: Supple. No bruits or JVD appreciated. Lungs: No increased work of breathing. Clear to auscultation bilaterally. No wheezes, rhonchi, or rales. Heart: RRR. Distinct S1 and S2. No murmurs, gallops, or rubs.  Abdomen: Soft, obese, and non-tender to palpation. Bowel sounds present. Extremities: No lower extremity edema. Distal pedal pulses 2+ and equal bilaterally. All extremities warm to the touch with no evidence of cyanosis. Neuro: Alert and oriented x3. No focal deficits. Moves all extremities spontaneously. Psych: Normal affect.  Labs    Troponin [facility] Test) Recent Labs    08/09/18 1304  TROPIPOC 0.04   No results for input(s): CKTOTAL, CKMB, TROPONINI in the last 72 hours. Lab Results  Component Value Date   WBC 10.1 08/09/2018   HGB 16.8 08/09/2018   HCT 49.1 08/09/2018   MCV 81.3 08/09/2018   PLT 241 08/09/2018    Recent Labs  Lab 08/10/18 0524  NA 136  K 3.5  CL 102  CO2 24  BUN 13  CREATININE 1.25*  CALCIUM 9.6  GLUCOSE 103*   No results found for: CHOL, HDL, LDLCALC, TRIG Lab Results  Component  Value Date   DDIMER 0.98 (H) 08/09/2018     Radiology Studies    Dg Chest 2 View  Result Date: 08/09/2018 CLINICAL DATA:  Chest pain, shortness of breath for 5 days. EXAM: CHEST - 2 VIEW COMPARISON:  None. FINDINGS: Borderline cardiomegaly. Overall cardiomediastinal silhouette is within normal limits. Mildly prominent interstitial markings bilaterally suggesting edema versus chronic interstitial lung disease. No confluent opacity to suggest a consolidating pneumonia. No pleural effusion or pneumothorax seen. No acute or suspicious osseous finding. IMPRESSION: 1. Mildly prominent interstitial markings bilaterally. Without prior films for comparison, this could represent either acute interstitial edema or chronic interstitial lung disease. No evidence of overt alveolar pulmonary edema. No evidence of pneumonia. 2. Borderline cardiomegaly. Electronically Signed   By: Franki Cabot M.D.   On: 08/09/2018 13:58   Ct Angio Chest Pe W And/or Wo Contrast  Result Date: 08/09/2018 CLINICAL DATA:  Shortness of breath, cough EXAM: CT ANGIOGRAPHY CHEST WITH CONTRAST TECHNIQUE: Multidetector CT imaging of the chest was performed using the standard protocol during bolus administration of intravenous contrast. Multiplanar CT image reconstructions and MIPs were obtained to evaluate the vascular anatomy. CONTRAST:  167mL ISOVUE-370 IOPAMIDOL (ISOVUE-370) INJECTION 76% COMPARISON:  Chest CT earlier today FINDINGS: Cardiovascular: Heart is mildly enlarged. Aorta is normal caliber. No filling defects in the pulmonary arteries to suggest pulmonary emboli. Mediastinum/Nodes: No mediastinal, hilar, or axillary adenopathy. Lungs/Pleura: Mild vascular congestion. Ground-glass opacities in the lower lungs could reflect early edema or atelectasis. Trace bilateral effusions. Upper Abdomen: Imaging into the upper abdomen shows no acute findings. Musculoskeletal: Chest wall soft tissues are unremarkable. No acute bony abnormality.  Review of the MIP images confirms the above findings. IMPRESSION: No evidence of pulmonary embolus. Borderline cardiomegaly. Vascular congestion. Ground-glass opacities in the lower lobes could reflect early interstitial edema or atelectasis. Trace bilateral effusions. Electronically Signed   By: Rolm Baptise M.D.   On: 08/09/2018 21:32    EKG     EKG: EKG was personally reviewed and demonstrates: Normal sinus rhythm, rate 96 bpm, with no acute ischemic changes (no prior tracings for comparison).  Telemetry: Telemetry was personally reviewed and demonstrates: Normal sinus rhythm/ mild sinus tachycardia, rate ranging between 70s and 110s bpm.   Cardiac Imaging    Echocardiogram 08/10/2018:  Study Conclusions - Left ventricle: The cavity size was normal. There was mild   concentric hypertrophy. Systolic function was at the lower limits   of normal. The estimated ejection fraction was in the range of   50% to 55%. Mild diffuse hypokinesis with no identifiable   regional variations. Doppler parameters are consistent with   abnormal left ventricular relaxation (grade 1 diastolic   dysfunction).  Assessment & Plan    1. Acute Congestive Heart Failure with Hypoxia  - Likely in the setting of uncontrolled hypertension.  - BNP elevated at 639.7. - Echo showed EF of 50-55% with mild LVH, mild diffuse hypokinesis with no identifiable regional variation, and grade 1 diastolic dysfunction.  - Chest x-ray showed cardiomegaly and mildly prominent interstitial markings bilaterally. CTA chest negative for PE but showed mild vascular congestion and trace bilateral effusions.  - Patient presented with worsening shortness of breath, orthopnea, and PND. O2 sats 88% with ambulation in the ED.   - Net I's & O's negative 2.4 since admission.  - Weight 253.1lbs today (down from 256.8lbs yesterday). - Patient does not appear volume overloaded on exam. Lungs are clear and no significant peripheral edema. Patient  reports breathing has improved but still notes some dyspnea with exertion. - Currently on IV Lasix 40mg  twice daily. Consider switching to PO tomorrow. - Continue control of BP. - Continue monitoring daily weights and strict I's & O's.  2. Hypertensive Urgency - Patient presented to the ED with BP of 202/141. Patient received Amlodipine 10mg  and IV Lasix in the ED. He also got one dose of IV Hydralazine last night. - BP improving but still elevated. Most recent BP 147/108. - Continue Amlodipine 10mg  daily. May need to add ACEi/ARB tomorrow depending on renal function. - Continue to monitor.   3. Renal Insufficiency - SCr 1.27 yesterday upon arrival to ED and 1.25 today, up from 0.89 from BMP 5 years ago.  - Continue to monitor renal function daily while be diuresed.  - Per primary team.   Signed, Darreld Mclean, PA-C 08/10/2018, 3:38 PM  For questions or updates, please contact   Please consult [email or web address] for contact info under Cardiology/STEMI.  I have personally seen and examined this patient with Sande Rives, PA. I agree with the assessment and plan as outlined above. Curtis Patton is admitted with dyspnea and found to have volume overload c/w acute CHF. His LV systolic function is normal by echo. His presentation is most c/w acute diastolic CHF due to uncontrolled HTN. His BP is better controlled. He has been diuresed and is feeling much better with minimal dyspnea now. Labs reviewed by me. EKG reviewed by me and shows sinus with poor R wave progression precordial leads.  Exam: NAD. CV:RRR no murmur. Pulm: clear bilaterally  Abd: soft, NT, ND  Ext: no LE edema Plan: Hypertensive heart disease/acute diastolic CHF: He has diuresed well with IV Lasix. He is much improved. Still with mild dyspnea when lying flat and when ambulating. I would recommend additional IV Lasix tonight. Continue amlodipine. Consider adding an ARB tomorrow if needed for BP control. He will probably need a low  dose of oral Lasix at discharge. We will see him in the morning.   Lauree Chandler 08/10/2018 4:09 PM

## 2018-08-10 NOTE — Progress Notes (Signed)
SATURATION QUALIFICATIONS:  Patient Saturations on Room Air at Rest = 98%  Patient Saturations on Room Air while Ambulating = 95%

## 2018-08-11 DIAGNOSIS — N183 Chronic kidney disease, stage 3 (moderate): Secondary | ICD-10-CM

## 2018-08-11 DIAGNOSIS — I5033 Acute on chronic diastolic (congestive) heart failure: Secondary | ICD-10-CM

## 2018-08-11 DIAGNOSIS — J9601 Acute respiratory failure with hypoxia: Secondary | ICD-10-CM

## 2018-08-11 LAB — BASIC METABOLIC PANEL
ANION GAP: 9 (ref 5–15)
BUN: 19 mg/dL (ref 6–20)
CHLORIDE: 103 mmol/L (ref 98–111)
CO2: 26 mmol/L (ref 22–32)
Calcium: 9.3 mg/dL (ref 8.9–10.3)
Creatinine, Ser: 1.36 mg/dL — ABNORMAL HIGH (ref 0.61–1.24)
GFR calc non Af Amer: 58 mL/min — ABNORMAL LOW (ref >60)
Glucose, Bld: 97 mg/dL (ref 70–99)
POTASSIUM: 3.2 mmol/L — AB (ref 3.5–5.1)
SODIUM: 138 mmol/L (ref 135–145)

## 2018-08-11 MED ORDER — CARVEDILOL 3.125 MG PO TABS: 3.1250 mg | ORAL_TABLET | Freq: Two times a day (BID) | ORAL | 0 refills | Status: DC

## 2018-08-11 MED ORDER — CARVEDILOL 3.125 MG PO TABS: 3.1250 mg | ORAL_TABLET | Freq: Two times a day (BID) | ORAL | Status: DC

## 2018-08-11 MED ORDER — FUROSEMIDE 20 MG PO TABS: 20.0000 mg | ORAL_TABLET | Freq: Every day | ORAL | 0 refills | Status: DC

## 2018-08-11 MED ORDER — LOSARTAN POTASSIUM 25 MG PO TABS: 25.0000 mg | ORAL_TABLET | Freq: Every day | ORAL | 0 refills | Status: DC

## 2018-08-11 MED ORDER — LOSARTAN POTASSIUM 25 MG PO TABS
25.0000 mg | ORAL_TABLET | Freq: Every day | ORAL | Status: DC
  Administered 2018-08-11: 25 mg via ORAL
  Filled 2018-08-11: qty 1

## 2018-08-11 MED ORDER — AMLODIPINE BESYLATE 10 MG PO TABS: 10.0000 mg | ORAL_TABLET | Freq: Every day | ORAL | 0 refills | Status: DC

## 2018-08-11 MED ORDER — POTASSIUM CHLORIDE CRYS ER 20 MEQ PO TBCR
40.0000 meq | EXTENDED_RELEASE_TABLET | ORAL | Status: DC
  Administered 2018-08-11: 40 meq via ORAL

## 2018-08-11 MED FILL — AMLODIPINE BESYLATE 10 MG T: 10 | 30 days supply | Qty: 30 | Fill #0

## 2018-08-11 MED FILL — LOSARTAN POTASSIUM 25 MG TA: 25 | 30 days supply | Qty: 30 | Fill #0

## 2018-08-11 MED FILL — FUROSEMIDE 20 MG TAB: 20 | 30 days supply | Qty: 30 | Fill #0

## 2018-08-11 MED FILL — CARVEDILOL 3.125 MG TABLET: 3.125 | 30 days supply | Qty: 60 | Fill #0

## 2018-08-11 NOTE — Discharge Instructions (Signed)

## 2018-08-11 NOTE — Progress Notes (Addendum)
Progress Note  Patient Name: Curtis Patton Date of Encounter: 08/11/2018  Primary Cardiologist: Lauree Chandler, MD -New  Subjective   Curtis Patton feels much better. He was able to lay flat last night to sleep. His breathing is back to normal.   Inpatient Medications    Scheduled Meds: . amLODipine  10 mg Oral Daily  . enoxaparin (LOVENOX) injection  40 mg Subcutaneous Q24H  . furosemide  40 mg Intravenous Q12H  . losartan  25 mg Oral Daily  . sodium chloride flush  3 mL Intravenous Q12H   Continuous Infusions: . sodium chloride     PRN Meds: sodium chloride, acetaminophen, hydrALAZINE, ondansetron (ZOFRAN) IV, sodium chloride flush   Vital Signs    Vitals:   08/10/18 2357 08/11/18 0514 08/11/18 0923 08/11/18 1114  BP: (!) 133/98 (!) 154/109 (!) 106/93 (!) 157/100  Pulse: 89 90  90  Resp:  20    Temp: (!) 97.5 F (36.4 C) 97.9 F (36.6 C)    TempSrc: Oral Oral    SpO2: 94% 95%    Weight:  114.2 kg      Intake/Output Summary (Last 24 hours) at 08/11/2018 1123 Last data filed at 08/11/2018 0851 Gross per 24 hour  Intake 900 ml  Output 2300 ml  Net -1400 ml   Filed Weights   08/09/18 2333 08/10/18 0556 08/11/18 0514  Weight: 116.5 kg 114.8 kg 114.2 kg    Telemetry    Sinus rhythm around 90 bpm- Personally Reviewed  ECG    No new tracings- Personally Reviewed  Physical Exam   GEN: No acute distress.   Neck: No JVD Cardiac: RRR, no murmurs, rubs, or gallops.  Respiratory: Clear to auscultation bilaterally. GI: Soft, nontender, non-distended  MS: No edema; No deformity. Neuro:  Nonfocal  Psych: Normal affect   Labs    Chemistry Recent Labs  Lab 08/09/18 1244 08/10/18 0524 08/11/18 0502  NA 138 136 138  K 4.6 3.5 3.2*  CL 107 102 103  CO2 21* 24 26  GLUCOSE 98 103* 97  BUN 10 13 19   CREATININE 1.27* 1.25* 1.36*  CALCIUM 9.2 9.6 9.3  GFRNONAA >60 >60 58*  GFRAA >60 >60 >60  ANIONGAP 10 10 9      Hematology Recent Labs    Lab 08/09/18 1244  WBC 10.1  RBC 6.04*  HGB 16.8  HCT 49.1  MCV 81.3  MCH 27.8  MCHC 34.2  RDW 13.9  PLT 241    Cardiac EnzymesNo results for input(s): TROPONINI in the last 168 hours.  Recent Labs  Lab 08/09/18 1304  TROPIPOC 0.04     BNP Recent Labs  Lab 08/09/18 1244  BNP 639.7*     DDimer  Recent Labs  Lab 08/09/18 1244  DDIMER 0.98*     Radiology    Dg Chest 2 View  Result Date: 08/09/2018 CLINICAL DATA:  Chest pain, shortness of breath for 5 days. EXAM: CHEST - 2 VIEW COMPARISON:  None. FINDINGS: Borderline cardiomegaly. Overall cardiomediastinal silhouette is within normal limits. Mildly prominent interstitial markings bilaterally suggesting edema versus chronic interstitial lung disease. No confluent opacity to suggest a consolidating pneumonia. No pleural effusion or pneumothorax seen. No acute or suspicious osseous finding. IMPRESSION: 1. Mildly prominent interstitial markings bilaterally. Without prior films for comparison, this could represent either acute interstitial edema or chronic interstitial lung disease. No evidence of overt alveolar pulmonary edema. No evidence of pneumonia. 2. Borderline cardiomegaly. Electronically Signed   By: Cherlynn Kaiser  Enriqueta Shutter M.D.   On: 08/09/2018 13:58   Ct Angio Chest Pe W And/or Wo Contrast  Result Date: 08/09/2018 CLINICAL DATA:  Shortness of breath, cough EXAM: CT ANGIOGRAPHY CHEST WITH CONTRAST TECHNIQUE: Multidetector CT imaging of the chest was performed using the standard protocol during bolus administration of intravenous contrast. Multiplanar CT image reconstructions and MIPs were obtained to evaluate the vascular anatomy. CONTRAST:  119mL ISOVUE-370 IOPAMIDOL (ISOVUE-370) INJECTION 76% COMPARISON:  Chest CT earlier today FINDINGS: Cardiovascular: Heart is mildly enlarged. Aorta is normal caliber. No filling defects in the pulmonary arteries to suggest pulmonary emboli. Mediastinum/Nodes: No mediastinal, hilar, or  axillary adenopathy. Lungs/Pleura: Mild vascular congestion. Ground-glass opacities in the lower lungs could reflect early edema or atelectasis. Trace bilateral effusions. Upper Abdomen: Imaging into the upper abdomen shows no acute findings. Musculoskeletal: Chest wall soft tissues are unremarkable. No acute bony abnormality. Review of the MIP images confirms the above findings. IMPRESSION: No evidence of pulmonary embolus. Borderline cardiomegaly. Vascular congestion. Ground-glass opacities in the lower lobes could reflect early interstitial edema or atelectasis. Trace bilateral effusions. Electronically Signed   By: Rolm Baptise M.D.   On: 08/09/2018 21:32    Cardiac Studies   Echocardiogram 08/10/2018 Study Conclusions - Left ventricle: The cavity size was normal. There was mild   concentric hypertrophy. Systolic function was at the lower limits   of normal. The estimated ejection fraction was in the range of   50% to 55%. Mild diffuse hypokinesis with no identifiable   regional variations. Doppler parameters are consistent with   abnormal left ventricular relaxation (grade 1 diastolic   dysfunction).  Patient Profile     52 y.o. male  with a history of hypertension, who is being seen for the evaluation of acute congestive heart failure. No known CAD. Had been off HTN meds and was significantly hypertensive. BNP 639.  Hypoxic on admission. LV function normal by echo.   Assessment & Plan    Acute diastolic CHF -Likely related to uncontrolled hypertension.  Echo done yesterday showed normal LV systolic function with EF 01-60%, grade 1 diastolic dysfunction -Patient was started on Lasix 40 mg IV twice daily -2.2 L of urine output yesterday, patient is net -3.7 L fluid balance since admission.  His weight is down 5 pounds -Oxygen saturation is now normal at rest and with ambulation. -Breathing is back to normal and he was able to lay flat to sleep last night -Advise a low dose of Lasix 20  mg daily until follow-up and then may consider discontinuing if BP is well controlled.  Hypertensive urgency -Blood pressure as high as 202/141 at presentation -Patient had not been taking his medications due to lack of insurance.  He now has insurance and has been set up with home health for follow-up. -Patient has been started on amlodipine 10 mg daily and is diuresing with IV Lasix -Blood pressure is better controlled, but still elevated.  I will add losartan 25 mg daily -I discussed low-sodium diet, medication compliance and medical follow-up to keep his blood pressure under control.  Renal insufficiency -Serum creatinine was 1.27 on admission, 1.36 today with a BUN of 19-up from 10.  He may be getting a little dry. -Will stop IV lasix as he is clinically euvolemic.  Hypokalemia -K+ 3.2. I will order KDur 40 mEq X2. Also stopped lasix.   [facility] will sign off.   Medication Recommendations: Continue amlodipine, losartan, low-dose Lasix Other recommendations (labs, testing, etc): Follow-up renal function as an  outpatient Follow up as an outpatient:  Appt for our clinic on 09/10/18  For questions or updates, please contact [place] Please consult [email or web address] for contact info under        Signed, Daune Perch, NP  08/11/2018, 11:23 AM    Personally seen and examined. Agree with above.  Feeling better, almost back to baseline.  Blood pressures much better controlled.  Continue with his medications as above, amlodipine losartan low-dose Lasix.  Exam unremarkable, nonfocal neurologically, heart regular rate and rhythm, obese. Acute diastolic heart failure/chronic kidney disease stage III/hyperkalemia -Agree with plan as above.  Continue current antihypertensives.  Okay with transition to p.o. Lasix.  Also sounds good to add a beta-blocker to assist with both blood pressure control as well as heart rate.  This may help as well and diastolic heart failure.  Signing  off.  Candee Furbish, MD

## 2018-08-11 NOTE — Discharge Summary (Signed)
Physician Discharge Summary  Curtis Patton UUV:[id number] DOB: [birth year] DOA: 08/09/2018  PCP: Patient, No Pcp Per  Admit date: 08/09/2018 Discharge date: 08/11/2018  Time spent: 35 minutes  Recommendations for Outpatient Follow-up:  1. Follow up outpatient CBC/CMP 2. Follow up tachycardia as outpatient, pt started on carvedilol prior to discharge, but no TSH checked yet.  This was ordered prior to discharge, but not collected.  Will need to be followed up as an outpatient. 3. Follow volume status and continued need for lasix 4. Follow electrolytes and kidney function with lasix/losartan going forward    Discharge Diagnoses:  Principal Problem:   Acute respiratory failure with hypoxia ([place]) Active Problems:   Acute CHF (congestive heart failure) (HCC)   Hypertension   Hypertensive urgency   Mild renal insufficiency   Acute on chronic diastolic CHF (congestive heart failure) (HCC)   Acute diastolic CHF (congestive heart failure), NYHA class 3 ([place])   Hypertensive crisis   Discharge Condition: stable  Diet recommendation: heart health  Filed Weights   08/09/18 2333 08/10/18 0556 08/11/18 0514  Weight: 116.5 kg 114.8 kg 114.2 kg    History of present illness:  Curtis Patton 52 y.o.malewith medical history significant forhypertension previously on antihypertensives but not in the past 6 months or more,presented 10/100for evaluation of shortness of breath and orthopnea. Patient reportedthat he was in his usual state until approximately 5 days priorwhen he noted dyspnea with mild exertion. He thendeveloped persistent shortness of breath, worse with any exertion, lying down, or bending over. He deniedchest pain, fevers, or chills. He reporteda mild nonproductive cough. Deniedleg swelling or tenderness. Deniedany wheezing.  ED Course: Upon arrival to the ED, patient is found to beafebrile, saturating 88% on room air with slight exertion, mildly tachypneic,  and hypertensive to 200/140. EKG features Micala Saltsman sinus rhythm with left axis deviation. Chest x-ray is notable for interstitial prominence suggestive of edema or chronic interstitial disease. Chemistry panel is notable for [place] creatinine 1.27, up from 0.9 several years ago. CBC is unremarkable, troponin is normal, and BNP is elevated to 640. D-dimer was elevated to 0.98. CTA chest is negative for PE, but notable for borderline cardiomegaly, vascular congestion, and groundglass opacities suggestive of early interstitial edema. Patient was given 10 mg Norvasc and 40 mg IV Lasix in the ED. He remains hemodynamically stable, is not in acute distress, and will be observed for ongoing evaluation and management of suspected new-onset CHF  He was admitted for HF exacerbation, diuresed, and improved with BP control and diuresis.  Hospital Course:  Acute respiratory failure with hypoxia secondary to acute CHF in setting of hypertensive urgency related to non-complianc -oxygen saturation level 88% with ambulation in ED. Chest xray with borderline cardiomegaly and mildly prominent interstitial markings bilaterally. CT angio chest negtative for PE, mild vascular congestion, trace bilateral effusions.  - he's significantly improved with BP control and IV lasix, feeling essentially back to normal today  Acute CHFlikely diastolic in setting of uncontrolled BP due to non-compliance -Presentedwith several days of SOB with orthopnea. Provided with IV lasix. Much improved this am. Still DOE -lasix dc'd today, planning to discharge on 20 mg daily per cardiology (consider d/c if BP well controlled) -followechocardiogram (normal EF, grade 1 dd - see report)  -cardiology consulted, recommending PO lasix and beta blocker  # Tachycardia: sinus, negative CTA for PE and echo as noted below.  Pt has not had TSH yet, this was ordered but not collected prior to discharge, follow up  as outpatient  2.Hypertension with  hypertensive urgency -Pt reports hx of HTN, previouslytook Prinzide according to chart. Not taking for 6 months due to no insurance. BP better controlled this am with norvasc, lasix, and losartan.  Started on carvedilol given tachycardia noted today. Continue to follow as an outpatient.  3.Mild renal insufficiency -1.36 today in setting of diuresis, will decrease to 20 mg PO lasix daily - follow in setting on new diuretic and arb  Hypokalemia: repleted, follow with lasix and arb  Procedures: Echo Study Conclusions  - Left ventricle: The cavity size was normal. There was mild   concentric hypertrophy. Systolic function was at the lower limits   of normal. The estimated ejection fraction was in the range of   50% to 55%. Mild diffuse hypokinesis with no identifiable   regional variations. Doppler parameters are consistent with   abnormal left ventricular relaxation (grade 1 diastolic   dysfunction).  Consultations:  Cardiology  Discharge Exam: Vitals:   08/11/18 1155 08/11/18 1157  BP:  (!) 140/103  Pulse: 89 91  Resp:  18  Temp:  (!) 97.5 F (36.4 C)  SpO2: 96% 94%   Feeling back to normal Would like to go home  General: No acute distress.  Standing up, washing off. Cardiovascular: Heart sounds show Gelsey Amyx regular rate, and rhythm. Mildly tachy. Lungs: Clear to auscultation bilaterally  Abdomen: Soft, nontender, nondistended  Neurological: Alert and oriented 3. Moves all extremities 4. Cranial nerves II through XII grossly intact. Skin: Warm and dry. No rashes or lesions. Extremities: No clubbing or cyanosis. No edema.  Psychiatric: Mood and affect are normal. Insight and judgment are appropriate.  Discharge Instructions   Discharge Instructions    Call MD for:  difficulty breathing, headache or visual disturbances   Complete by:  As directed    Call MD for:  extreme fatigue   Complete by:  As directed    Call MD for:  hives   Complete by:  As directed     Call MD for:  persistant dizziness or light-headedness   Complete by:  As directed    Call MD for:  persistant nausea and vomiting   Complete by:  As directed    Call MD for:  severe uncontrolled pain   Complete by:  As directed    Call MD for:  temperature >100.4   Complete by:  As directed    Diet - low sodium heart healthy   Complete by:  As directed    Discharge instructions   Complete by:  As directed    You were seen for Ellyce Lafevers heart failure exacerbation in the setting of hypertension (high blood pressure).  You improved with lasix and blood pressure control.  We've started you on 4 new medications (losartan, carvedilol, amlodipine, and lasix).   Please ensure you follow up with Dr. Volanda Napoleon as scheduled.  It will be important to review this hospitalization as well as follow up new labs to ensure your medications don't need to be adjusted going forward.  Check your weights daily.  If you notice your weight increasing more than 2-3 lbs in 1 day or more than 5 lbs in 1 week, you should call your PCP.  Your heart rate is Kymoni Lesperance little fast.  This should improve with your medications above, but we also should follow up your thyroid and continue to follow this with your PCP or cardiology in the future.  Return for new, recurrent, or worsening symptoms.  Please ask  your PCP to request records from this hospitalization so they know what was done and what the next steps will be.   Increase activity slowly   Complete by:  As directed      Allergies as of 08/11/2018      Reactions   Contrast Media [iodinated Diagnostic Agents]       Medication List    STOP taking these medications   naproxen 500 MG tablet Commonly known as:  NAPROSYN     TAKE these medications   amLODipine 10 MG tablet Commonly known as:  NORVASC Take 1 tablet (10 mg total) by mouth daily. Start taking on:  08/12/2018   aspirin-acetaminophen-caffeine 250-250-65 MG tablet Commonly known as:  EXCEDRIN MIGRAINE Take 1  tablet by mouth every 6 (six) hours as needed for headache.   carvedilol 3.125 MG tablet Commonly known as:  COREG Take 1 tablet (3.125 mg total) by mouth 2 (two) times daily with Murphy Duzan meal.   furosemide 20 MG tablet Commonly known as:  LASIX Take 1 tablet (20 mg total) by mouth daily.   losartan 25 MG tablet Commonly known as:  COZAAR Take 1 tablet (25 mg total) by mouth daily. Start taking on:  08/12/2018      Allergies  Allergen Reactions  . Contrast Media [Iodinated Diagnostic Agents]    Follow-up Information    Billie Ruddy, MD Follow up on 08/19/2018.   Specialty:  Family Medicine Why:  at 2 pm; please try to keep your apt or call to reschedule Contact information: [place] [id number] [phone]        Consuelo Pandy, PA-C Follow up.   Specialties:  Cardiology, Radiology Why:  Cardiology hospital follow up on 09/10/18 at 9:00. Please arrive 15 minutes early for check-in. Contact information: [place] [address] [phone]            The results of significant diagnostics from this hospitalization (including imaging, microbiology, ancillary and laboratory) are listed below for reference.    Significant Diagnostic Studies: Dg Chest 2 View  Result Date: 08/09/2018 CLINICAL DATA:  Chest pain, shortness of breath for 5 days. EXAM: CHEST - 2 VIEW COMPARISON:  None. FINDINGS: Borderline cardiomegaly. Overall cardiomediastinal silhouette is within normal limits. Mildly prominent interstitial markings bilaterally suggesting edema versus chronic interstitial lung disease. No confluent opacity to suggest Brendalee Matthies consolidating pneumonia. No pleural effusion or pneumothorax seen. No acute or suspicious osseous finding. IMPRESSION: 1. Mildly prominent interstitial markings bilaterally. Without prior films for comparison, this could represent either acute interstitial edema or chronic interstitial lung disease. No  evidence of overt alveolar pulmonary edema. No evidence of pneumonia. 2. Borderline cardiomegaly. Electronically Signed   By: Franki Cabot M.D.   On: 08/09/2018 13:58   Ct Angio Chest Pe W And/or Wo Contrast  Result Date: 08/09/2018 CLINICAL DATA:  Shortness of breath, cough EXAM: CT ANGIOGRAPHY CHEST WITH CONTRAST TECHNIQUE: Multidetector CT imaging of the chest was performed using the standard protocol during bolus administration of intravenous contrast. Multiplanar CT image reconstructions and MIPs were obtained to evaluate the vascular anatomy. CONTRAST:  144mL ISOVUE-370 IOPAMIDOL (ISOVUE-370) INJECTION 76% COMPARISON:  Chest CT earlier today FINDINGS: Cardiovascular: Heart is mildly enlarged. Aorta is normal caliber. No filling defects in the pulmonary arteries to suggest pulmonary emboli. Mediastinum/Nodes: No mediastinal, hilar, or axillary adenopathy. Lungs/Pleura: Mild vascular congestion. Ground-glass opacities in the lower lungs could reflect early edema or atelectasis. Trace bilateral effusions. Upper  Abdomen: Imaging into the upper abdomen shows no acute findings. Musculoskeletal: Chest wall soft tissues are unremarkable. No acute bony abnormality. Review of the MIP images confirms the above findings. IMPRESSION: No evidence of pulmonary embolus. Borderline cardiomegaly. Vascular congestion. Ground-glass opacities in the lower lobes could reflect early interstitial edema or atelectasis. Trace bilateral effusions. Electronically Signed   By: Rolm Baptise M.D.   On: 08/09/2018 21:32    Microbiology: No results found for this or any previous visit (from the past 240 hour(s)).   Labs: Basic Metabolic Panel: Recent Labs  Lab 08/09/18 1244 08/10/18 0524 08/11/18 0502  NA 138 136 138  K 4.6 3.5 3.2*  CL 107 102 103  CO2 21* 24 26  GLUCOSE 98 103* 97  BUN 10 13 19   CREATININE 1.27* 1.25* 1.36*  CALCIUM 9.2 9.6 9.3   Liver Function Tests: No results for input(s): AST, ALT, ALKPHOS,  BILITOT, PROT, ALBUMIN in the last 168 hours. No results for input(s): LIPASE, AMYLASE in the last 168 hours. No results for input(s): AMMONIA in the last 168 hours. CBC: Recent Labs  Lab 08/09/18 1244  WBC 10.1  HGB 16.8  HCT 49.1  MCV 81.3  PLT 241   Cardiac Enzymes: No results for input(s): CKTOTAL, CKMB, CKMBINDEX, TROPONINI in the last 168 hours. BNP: BNP (last 3 results) Recent Labs    08/09/18 1244  BNP 639.7*    ProBNP (last 3 results) No results for input(s): PROBNP in the last 8760 hours.  CBG: No results for input(s): GLUCAP in the last 168 hours.     Signed:  Fayrene Helper MD.  [facility] 08/11/2018, 11:42 PM

## 2018-08-18 ENCOUNTER — Encounter: Payer: Self-pay | Admitting: Cardiology

## 2018-08-19 ENCOUNTER — Ambulatory Visit (INDEPENDENT_AMBULATORY_CARE_PROVIDER_SITE_OTHER): Payer: Commercial Managed Care - PPO | Admitting: Family Medicine

## 2018-08-19 ENCOUNTER — Encounter: Payer: Self-pay | Admitting: Family Medicine

## 2018-08-19 VITALS — BP 110/72 | HR 92 | Temp 98.4°F | Ht 74.0 in | Wt 257.0 lb

## 2018-08-19 DIAGNOSIS — I10 Essential (primary) hypertension: Secondary | ICD-10-CM | POA: Diagnosis not present

## 2018-08-19 DIAGNOSIS — I5033 Acute on chronic diastolic (congestive) heart failure: Secondary | ICD-10-CM | POA: Diagnosis not present

## 2018-08-19 DIAGNOSIS — Z7689 Persons encountering health services in other specified circumstances: Secondary | ICD-10-CM

## 2018-08-19 LAB — BASIC METABOLIC PANEL
BUN: 12 mg/dL (ref 6–23)
CO2: 29 meq/L (ref 19–32)
CREATININE: 1.22 mg/dL (ref 0.40–1.50)
Calcium: 9.8 mg/dL (ref 8.4–10.5)
Chloride: 104 mEq/L (ref 96–112)
GFR: 79.97 mL/min (ref >60.00)
GLUCOSE: 79 mg/dL (ref 70–99)
POTASSIUM: 4.4 meq/L (ref 3.5–5.1)
Sodium: 139 mEq/L (ref 135–145)

## 2018-08-19 LAB — CBC WITH DIFFERENTIAL/PLATELET
BASOS PCT: 0.6 % (ref 0.0–3.0)
Basophils Absolute: 0.1 10*3/uL (ref 0.0–0.1)
EOS ABS: 0.4 10*3/uL (ref 0.0–0.7)
Eosinophils Relative: 3.3 % (ref 0.0–5.0)
HCT: 45.9 % (ref 39.0–52.0)
HEMOGLOBIN: 15.8 g/dL (ref 13.0–17.0)
LYMPHS ABS: 2.1 10*3/uL (ref 0.7–4.0)
Lymphocytes Relative: 19.5 % (ref 12.0–46.0)
MCHC: 34.3 g/dL (ref 30.0–36.0)
MCV: 83.4 fl (ref 78.0–100.0)
MONO ABS: 1.5 10*3/uL — AB (ref 0.1–1.0)
Monocytes Relative: 13.9 % — ABNORMAL HIGH (ref 3.0–12.0)
NEUTROS ABS: 6.8 10*3/uL (ref 1.4–7.7)
Neutrophils Relative %: 62.7 % (ref 43.0–77.0)
PLATELETS: 310 10*3/uL (ref 150.0–400.0)
RBC: 5.5 Mil/uL (ref 4.22–5.81)
RDW: 14.4 % (ref 11.5–15.5)
WBC: 10.8 10*3/uL — ABNORMAL HIGH (ref 4.0–10.5)

## 2018-08-19 LAB — TSH: TSH: 0.22 u[IU]/mL — AB (ref 0.35–4.50)

## 2018-08-19 MED ORDER — AMLODIPINE BESYLATE 10 MG PO TABS: 10.0000 mg | ORAL_TABLET | Freq: Every day | ORAL | 3 refills | Status: DC

## 2018-08-19 MED ORDER — FUROSEMIDE 20 MG PO TABS: 20.0000 mg | ORAL_TABLET | Freq: Every day | ORAL | 2 refills | Status: DC

## 2018-08-19 MED ORDER — CARVEDILOL 3.125 MG PO TABS: 3.1250 mg | ORAL_TABLET | Freq: Two times a day (BID) | ORAL | 3 refills | Status: DC

## 2018-08-19 MED ORDER — LOSARTAN POTASSIUM 25 MG PO TABS: 25.0000 mg | ORAL_TABLET | Freq: Every day | ORAL | 3 refills | Status: DC

## 2018-08-19 NOTE — Patient Instructions (Signed)
Heart Failure and Exercise Heart failure is a condition in which the heart does not fill or pump enough blood and oxygen to support your body and its functions. Heart failure is a long-term (chronic) condition. Living with heart failure can be challenging. However, following your health care provider's instructions about a healthy lifestyle may help improve your symptoms. This includes choosing the right exercise plan. Doing daily physical activity is important after a diagnosis of heart failure. You may have some activity restrictions, so talk to your health care provider before doing any exercises. What are the benefits of exercise? Exercise may:  Make your heart muscles stronger.  Lower your blood pressure.  Lower your cholesterol.  Help you lose weight.  Help your bones stay strong.  Improve your blood circulation.  Help your body use oxygen better. This relieves symptoms such as fatigue and shortness of breath.  Help your mental health by lowering the risk of depression and other problems.  Improve your quality of life.  Decrease your chance of hospital admission for heart failure.  What is an exercise plan? An exercise plan is a set of specific exercises and training activities. You will work with your health care provider to create the exercise plan that works for you. The plan may include:  Different types of exercises and how to do them.  Cardiac rehabilitation exercises. These are supervised programs that are designed to strengthen your heart.  What are strengthening exercises? Strengthening exercises are a type of physical activity that involves using resistance to improve your muscle strength. Strengthening exercises usually have repetitive motions. These types of exercises can include:  Lifting weights.  Using weight machines.  Using resistance tubes and bands.  Using kettlebells.  Using your body weight, such as doing push-ups or squats.  What are balance  exercises? Balance exercises are another type of physical activity. They strengthen the muscles of the back, stomach, and pelvis (core muscles) and improve your balance. They can also lower your risk of falling. These types of exercises can include:  Standing on one leg.  Walking backward, sideways, and in a straight line.  Standing up after sitting, without using your hands.  Shifting your weight from one leg to the other.  Lifting one leg in front of you.  Doing tai chi. This is a type of exercise that uses slow movements and deep breathing.  How can I increase my flexibility? Having better flexibility can keep you from falling. It can also lengthen your muscles, improve your range of motion, and help your joints. You can increase your flexibility by:  Doing tai chi.  Doing yoga.  Stretching.  How much aerobic exercise should I get? Aerobic exercises strengthen your breathing and circulation system and increase your body's use of oxygen. Examples of aerobic exercise include biking, walking, running, and swimming. Talk to your health care provider to find out how much aerobic exercise is safe for you.  To do these exercises:  Start exercising slowly, limiting the amount of time at first. You may need to start with 5 minutes of aerobic exercise every day.  Slowly add more minutes until you can safely do at least 30 minutes of exercise at least 4 days a week.  Summary  Daily physical activity is important after a diagnosis of heart failure.  Exercise can make your heart muscles stronger. It also offers other benefits that will improve your health.  Talk to your health care provider before doing any exercises. This information is  not intended to replace advice given to you by your health care provider. Make sure you discuss any questions you have with your health care provider. Document Released: 02/17/2017 Document Revised: 02/17/2017 Document Reviewed: 02/17/2017 Elsevier  Interactive Patient Education  2018 [employer].  How to Take Your Blood Pressure You can take your blood pressure at home with a machine. You may need to check your blood pressure at home:  To check if you have high blood pressure (hypertension).  To check your blood pressure over time.  To make sure your blood pressure medicine is working.  Supplies needed: You will need a blood pressure machine, or monitor. You can buy one at a drugstore or online. When choosing one:  Choose one with an arm cuff.  Choose one that wraps around your upper arm. Only one finger should fit between your arm and the cuff.  Do not choose one that measures your blood pressure from your wrist or finger.  Your doctor can suggest a monitor. How to prepare Avoid these things for 30 minutes before checking your blood pressure:  Drinking caffeine.  Drinking alcohol.  Eating.  Smoking.  Exercising.  Five minutes before checking your blood pressure:  Pee.  Sit in a dining chair. Avoid sitting in a soft couch or armchair.  Be quiet. Do not talk.  How to take your blood pressure Follow the instructions that came with your machine. If you have a digital blood pressure monitor, these may be the instructions: 1. Sit up straight. 2. Place your feet on the floor. Do not cross your ankles or legs. 3. Rest your left arm at the level of your heart. You may rest it on a table, desk, or chair. 4. Pull up your shirt sleeve. 5. Wrap the blood pressure cuff around the upper part of your left arm. The cuff should be 1 inch (2.5 cm) above your elbow. It is best to wrap the cuff around bare skin. 6. Fit the cuff snugly around your arm. You should be able to place only one finger between the cuff and your arm. 7. Put the cord inside the groove of your elbow. 8. Press the power button. 9. Sit quietly while the cuff fills with air and loses air. 10. Write down the numbers on the screen. 11. Wait 2-3 minutes and  then repeat steps 1-10.  What do the numbers mean? Two numbers make up your blood pressure. The first number is called systolic pressure. The second is called diastolic pressure. An example of a blood pressure reading is "120 over 80" (or 120/80). If you are an adult and do not have a medical condition, use this guide to find out if your blood pressure is normal: Normal  First number: below 120.  Second number: below 80. Elevated  First number: 120-129.  Second number: below 80. Hypertension stage 1  First number: 130-139.  Second number: 80-89. Hypertension stage 2  First number: 140 or above.  Second number: 28 or above. Your blood pressure is above normal even if only the top or bottom number is above normal. Follow these instructions at home:  Check your blood pressure as often as your doctor tells you to.  Take your monitor to your next doctor's appointment. Your doctor will: ? Make sure you are using it correctly. ? Make sure it is working right.  Make sure you understand what your blood pressure numbers should be.  Tell your doctor if your medicines are causing side effects.  Contact a doctor if:  Your blood pressure keeps being high. Get help right away if:  Your first blood pressure number is higher than 180.  Your second blood pressure number is higher than 120. This information is not intended to replace advice given to you by your health care provider. Make sure you discuss any questions you have with your health care provider. Document Released: 09/18/2008 Document Revised: 09/03/2016 Document Reviewed: 03/14/2016 Elsevier Interactive Patient Education  2018 [employer].  Managing Your Hypertension Hypertension is commonly called high blood pressure. This is when the force of your blood pressing against the walls of your arteries is too strong. Arteries are blood vessels that carry blood from your heart throughout your body. Hypertension forces the  heart to work harder to pump blood, and may cause the arteries to become narrow or stiff. Having untreated or uncontrolled hypertension can cause heart attack, stroke, kidney disease, and other problems. What are blood pressure readings? A blood pressure reading consists of a higher number over a lower number. Ideally, your blood pressure should be below 120/80. The first ("top") number is called the systolic pressure. It is a measure of the pressure in your arteries as your heart beats. The second ("bottom") number is called the diastolic pressure. It is a measure of the pressure in your arteries as the heart relaxes. What does my blood pressure reading mean? Blood pressure is classified into four stages. Based on your blood pressure reading, your health care provider may use the following stages to determine what type of treatment you need, if any. Systolic pressure and diastolic pressure are measured in a unit called mm Hg. Normal  Systolic pressure: below 671.  Diastolic pressure: below 80. Elevated  Systolic pressure: 245-809.  Diastolic pressure: below 80. Hypertension stage 1  Systolic pressure: 983-382.  Diastolic pressure: 50-53. Hypertension stage 2  Systolic pressure: 976 or above.  Diastolic pressure: 90 or above. What health risks are associated with hypertension? Managing your hypertension is an important responsibility. Uncontrolled hypertension can lead to:  A heart attack.  A stroke.  A weakened blood vessel (aneurysm).  Heart failure.  Kidney damage.  Eye damage.  Metabolic syndrome.  Memory and concentration problems.  What changes can I make to manage my hypertension? Hypertension can be managed by making lifestyle changes and possibly by taking medicines. Your health care provider will help you make a plan to bring your blood pressure within a normal range. Eating and drinking  Eat a diet that is high in fiber and potassium, and low in salt  (sodium), added sugar, and fat. An example eating plan is called the DASH (Dietary Approaches to Stop Hypertension) diet. To eat this way: ? Eat plenty of fresh fruits and vegetables. Try to fill half of your plate at each meal with fruits and vegetables. ? Eat whole grains, such as whole wheat pasta, brown rice, or whole grain bread. Fill about one quarter of your plate with whole grains. ? Eat low-fat diary products. ? Avoid fatty cuts of meat, processed or cured meats, and poultry with skin. Fill about one quarter of your plate with lean proteins such as fish, chicken without skin, beans, eggs, and tofu. ? Avoid premade and processed foods. These tend to be higher in sodium, added sugar, and fat.  Reduce your daily sodium intake. Most people with hypertension should eat less than 1,500 mg of sodium a day.  Limit alcohol intake to no more than 1 drink a day for  nonpregnant women and 2 drinks a day for men. One drink equals 12 oz of beer, 5 oz of wine, or 1 oz of hard liquor. Lifestyle  Work with your health care provider to maintain a healthy body weight, or to lose weight. Ask what an ideal weight is for you.  Get at least 30 minutes of exercise that causes your heart to beat faster (aerobic exercise) most days of the week. Activities may include walking, swimming, or biking.  Include exercise to strengthen your muscles (resistance exercise), such as weight lifting, as part of your weekly exercise routine. Try to do these types of exercises for 30 minutes at least 3 days a week.  Do not use any products that contain nicotine or tobacco, such as cigarettes and e-cigarettes. If you need help quitting, ask your health care provider.  Control any long-term (chronic) conditions you have, such as high cholesterol or diabetes. Monitoring  Monitor your blood pressure at home as told by your health care provider. Your personal target blood pressure may vary depending on your medical conditions,  your age, and other factors.  Have your blood pressure checked regularly, as often as told by your health care provider. Working with your health care provider  Review all the medicines you take with your health care provider because there may be side effects or interactions.  Talk with your health care provider about your diet, exercise habits, and other lifestyle factors that may be contributing to hypertension.  Visit your health care provider regularly. Your health care provider can help you create and adjust your plan for managing hypertension. Will I need medicine to control my blood pressure? Your health care provider may prescribe medicine if lifestyle changes are not enough to get your blood pressure under control, and if:  Your systolic blood pressure is 130 or higher.  Your diastolic blood pressure is 80 or higher.  Take medicines only as told by your health care provider. Follow the directions carefully. Blood pressure medicines must be taken as prescribed. The medicine does not work as well when you skip doses. Skipping doses also puts you at risk for problems. Contact a health care provider if:  You think you are having a reaction to medicines you have taken.  You have repeated (recurrent) headaches.  You feel dizzy.  You have swelling in your ankles.  You have trouble with your vision. Get help right away if:  You develop a severe headache or confusion.  You have unusual weakness or numbness, or you feel faint.  You have severe pain in your chest or abdomen.  You vomit repeatedly.  You have trouble breathing. Summary  Hypertension is when the force of blood pumping through your arteries is too strong. If this condition is not controlled, it may put you at risk for serious complications.  Your personal target blood pressure may vary depending on your medical conditions, your age, and other factors. For most people, a normal blood pressure is less than  120/80.  Hypertension is managed by lifestyle changes, medicines, or both. Lifestyle changes include weight loss, eating a healthy, low-sodium diet, exercising more, and limiting alcohol. This information is not intended to replace advice given to you by your health care provider. Make sure you discuss any questions you have with your health care provider. Document Released: 06/30/2012 Document Revised: 09/03/2016 Document Reviewed: 09/03/2016 Elsevier Interactive Patient Education  [employer].

## 2018-08-19 NOTE — Progress Notes (Signed)
Patient presents to clinic today to establish care.  SUBJECTIVE: PMH:  Pt is a 52 yo male with HTN, heart dz, and h/o migraines.  Pt was previously seen at [facility].  Pt was recently admitted 10/21-10/23 acute respiratory failure with hypoxia d/t CHF exacerbation.    HTN: -pt out of bp meds x 6 months + -since d/c has been taking meds but needs refills, only given 30 d supply -taking norvasc 10 mg daily, losartan 25 mg daily, lasix 20 mg, coreg 3.125 BID  CHF: -now taking norvasc 10 mg, losartan 25 mg, lasix 20 mg, and coreg 3.125 mg BID -limiting sodium intake -weighing himself daily -upcoming appt with Cardiology  Migraines: -was on imitrex -if need will take excedrin migraines -pain typically in R temple and behind R eye.  Allergies: Contrast dye--passed out, hives  Social hx: Pt is married.  He works as a Training and development officer.  Pt is a former smoker.  Smoked 1 ppd, quit 3 yrs ago.  Pt denies EtOH, tobacco, and drug use.   Past Medical History:  Diagnosis Date  . (HFpEF) heart failure with preserved ejection fraction ([place])    a. Echo 08/10/2018: LVEF 50-55% w/ mild LVH, mild diffuse hypokinesis w/ no regional variation, and G1DD  . Acute CHF ([place])   . Acute renal insufficiency   . Acute respiratory failure with hypoxia ([place])   . Hypertension   . Migraine     Past Surgical History:  Procedure Laterality Date  . JOINT REPLACEMENT    . neck    . ROTATOR CUFF REPAIR    . SPINE SURGERY      Current Outpatient Medications on File Prior to Visit  Medication Sig Dispense Refill  . amLODipine (NORVASC) 10 MG tablet Take 1 tablet (10 mg total) by mouth daily. 30 tablet 0  . aspirin-acetaminophen-caffeine (EXCEDRIN MIGRAINE) [phone] MG per tablet Take 1 tablet by mouth every 6 (six) hours as needed for headache.    . carvedilol (COREG) 3.125 MG tablet Take 1 tablet (3.125 mg total) by mouth 2 (two) times daily with a meal. 60 tablet 0  . furosemide (LASIX) 20 MG  tablet Take 1 tablet (20 mg total) by mouth daily. 30 tablet 0  . losartan (COZAAR) 25 MG tablet Take 1 tablet (25 mg total) by mouth daily. 30 tablet 0   No current facility-administered medications on file prior to visit.     Allergies  Allergen Reactions  . Contrast Media [Iodinated Diagnostic Agents]     Family History  Problem Relation Age of Onset  . Hypertension Other   . Hypertension Maternal Grandmother   . Sudden death Paternal 68        Died suddenly in her mid-50s. Unsure of cause.    Social History   Socioeconomic History  . Marital status: Married    Spouse name: Not on file  . Number of children: Not on file  . Years of education: Not on file  . Highest education level: Not on file  Occupational History  . Not on file  Social Needs  . Financial resource strain: Not on file  . Food insecurity:    Worry: Not on file    Inability: Not on file  . Transportation needs:    Medical: Not on file    Non-medical: Not on file  Tobacco Use  . Smoking status: Current Every Day Smoker    Packs/day: 2.00    Types: Cigars  . Smokeless  tobacco: Never Used  Substance and Sexual Activity  . Alcohol use: Yes    Comment: rarely  . Drug use: No  . Sexual activity: Not Currently    Partners: Female  Lifestyle  . Physical activity:    Days per week: Not on file    Minutes per session: Not on file  . Stress: Not on file  Relationships  . Social connections:    Talks on phone: Not on file    Gets together: Not on file    Attends religious service: Not on file    Active member of club or organization: Not on file    Attends meetings of clubs or organizations: Not on file    Relationship status: Not on file  . Intimate partner violence:    Fear of current or ex partner: Not on file    Emotionally abused: Not on file    Physically abused: Not on file    Forced sexual activity: Not on file  Other Topics Concern  . Not on file  Social History Narrative  .  Not on file    ROS General: Denies fever, chills, night sweats, changes in weight, changes in appetite HEENT: Denies headaches, ear pain, changes in vision, rhinorrhea, sore throat CV: Denies CP, palpitations, SOB, orthopnea Pulm: Denies SOB, cough, wheezing GI: Denies abdominal pain, nausea, vomiting, diarrhea, constipation GU: Denies dysuria, hematuria, frequency, vaginal discharge Msk: Denies muscle cramps, joint pains Neuro: Denies weakness, numbness, tingling Skin: Denies rashes, bruising Psych: Denies depression, anxiety, hallucinations  BP 110/72 (BP Location: Left Arm, Patient Position: Sitting, Cuff Size: Large)   Pulse 92   Temp 98.4 F (36.9 C) (Oral)   Ht _0  (1.88 m)   Wt 257 lb (116.6 kg)   SpO2 98%   BMI 33.00 kg/m   Physical Exam Gen. Pleasant, well developed, well-nourished, in NAD HEENT - [place]/AT, PERRL, no scleral icterus, no nasal drainage, pharynx without erythema or exudate. Lungs: no use of accessory muscles, CTAB, no wheezes, rales or rhonchi Cardiovascular: RRR, No r/g/m, no peripheral edema Neuro:  A&Ox3, CN II-XII intact, normal gait Skin:  Warm, dry, intact, no lesions  Recent Results (from the past 2160 hour(s))  Basic metabolic panel     Status: Abnormal   Collection Time: 08/09/18 12:44 PM  Result Value Ref Range   Sodium 138 135 - 145 mmol/L   Potassium 4.6 3.5 - 5.1 mmol/L    Comment: HEMOLYSIS AT THIS LEVEL MAY AFFECT RESULT   Chloride 107 98 - 111 mmol/L   CO2 21 (L) 22 - 32 mmol/L   Glucose, Bld 98 70 - 99 mg/dL   BUN 10 6 - 20 mg/dL   Creatinine, Ser 1.27 (H) 0.61 - 1.24 mg/dL   Calcium 9.2 8.9 - 10.3 mg/dL   GFR calc non Af Amer >60 >60 mL/min   GFR calc Af Amer >60 >60 mL/min    Comment: (NOTE) The eGFR has been calculated using the CKD EPI equation. This calculation has not been validated in all clinical situations. eGFR's persistently <60 mL/min signify possible Chronic Kidney Disease.    Anion gap 10 5 - 15    Comment:  Performed at [facility] [address] [place] [address]  CBC     Status: Abnormal   Collection Time: 08/09/18 12:44 PM  Result Value Ref Range   WBC 10.1 4.0 - 10.5 K/uL   RBC 6.04 (H) 4.22 - 5.81 MIL/uL   Hemoglobin 16.8  13.0 - 17.0 g/dL   HCT 49.1 39.0 - 52.0 %   MCV 81.3 80.0 - 100.0 fL   MCH 27.8 26.0 - 34.0 pg   MCHC 34.2 30.0 - 36.0 g/dL   RDW 13.9 11.5 - 15.5 %   Platelets 241 150 - 400 K/uL   nRBC 0.0 0.0 - 0.2 %    Comment: Performed at [facility], [place] [address] [place], [place] [address]  Brain natriuretic peptide     Status: Abnormal   Collection Time: 08/09/18 12:44 PM  Result Value Ref Range   B Natriuretic Peptide 639.7 (H) 0.0 - 100.0 pg/mL    Comment: Performed at [facility] [address] [place], [place] [address]  D-dimer, quantitative (not at [facility])     Status: Abnormal   Collection Time: 08/09/18 12:44 PM  Result Value Ref Range   D-Dimer, Quant 0.98 (H) 0.00 - 0.50 ug/mL-FEU    Comment: (NOTE) At the manufacturer cut-off of 0.50 ug/mL FEU, this assay has been documented to exclude PE with a sensitivity and negative predictive value of 97 to 99%.  At this time, this assay has not been approved by the FDA to exclude DVT/VTE. Results should be correlated with clinical presentation. Performed at [facility], [place] [address] [place], [place] [address]   I-stat troponin, ED     Status: None   Collection Time: 08/09/18  1:04 PM  Result Value Ref Range   Troponin i, poc 0.04 0.00 - 0.08 ng/mL   Comment 3            Comment: Due to the release kinetics of cTnI, a negative result within the first hours of the onset of symptoms does not rule out myocardial infarction with certainty. If myocardial infarction is still suspected, repeat the test at appropriate intervals.   HIV antibody (Routine Testing)     Status: None   Collection Time: 08/10/18  5:24 AM  Result Value Ref Range   HIV Screen 4th Generation wRfx Non  Reactive Non Reactive    Comment: (NOTE) Performed At: [facility] [place], [place] [id number] Rush Farmer MD LG:[id number]   Basic metabolic panel     Status: Abnormal   Collection Time: 08/10/18  5:24 AM  Result Value Ref Range   Sodium 136 135 - 145 mmol/L   Potassium 3.5 3.5 - 5.1 mmol/L   Chloride 102 98 - 111 mmol/L   CO2 24 22 - 32 mmol/L   Glucose, Bld 103 (H) 70 - 99 mg/dL   BUN 13 6 - 20 mg/dL   Creatinine, Ser 1.25 (H) 0.61 - 1.24 mg/dL   Calcium 9.6 8.9 - 10.3 mg/dL   GFR calc non Af Amer >60 >60 mL/min   GFR calc Af Amer >60 >60 mL/min    Comment: (NOTE) The eGFR has been calculated using the CKD EPI equation. This calculation has not been validated in all clinical situations. eGFR's persistently <60 mL/min signify possible Chronic Kidney Disease.    Anion gap 10 5 - 15    Comment: Performed at [facility] [address] [place], [place] [address]  ECHOCARDIOGRAM COMPLETE     Status: None   Collection Time: 08/10/18 10:39 AM  Result Value Ref Range   Weight 4,049.6 oz   BP 140/83 mmHg  Basic metabolic panel     Status: Abnormal   Collection Time: 08/11/18  5:02 AM  Result Value Ref Range  Sodium 138 135 - 145 mmol/L   Potassium 3.2 (L) 3.5 - 5.1 mmol/L   Chloride 103 98 - 111 mmol/L   CO2 26 22 - 32 mmol/L   Glucose, Bld 97 70 - 99 mg/dL   BUN 19 6 - 20 mg/dL   Creatinine, Ser 1.36 (H) 0.61 - 1.24 mg/dL   Calcium 9.3 8.9 - 10.3 mg/dL   GFR calc non Af Amer 58 (L) >60 mL/min   GFR calc Af Amer >60 >60 mL/min    Comment: (NOTE) The eGFR has been calculated using the CKD EPI equation. This calculation has not been validated in all clinical situations. eGFR's persistently <60 mL/min signify possible Chronic Kidney Disease.    Anion gap 9 5 - 15    Comment: Performed at [facility] [address] [place],  [address]    Assessment/Plan: Essential hypertension  -controlled -discussed checking bp at  home - Plan: Basic metabolic panel, losartan (COZAAR) 25 MG tablet, carvedilol (COREG) 3.125 MG tablet, amLODipine (NORVASC) 10 MG tablet  Acute on chronic diastolic CHF (congestive heart failure) (HCC)  -advised to keep f/u with Cardiology -discussed decreasing sodium intake -continue daily wts. -given handouts - Plan: Basic metabolic panel, CBC with Differential/Platelet, TSH, losartan (COZAAR) 25 MG tablet, furosemide (LASIX) 20 MG tablet, carvedilol (COREG) 3.125 MG tablet, amLODipine (NORVASC) 10 MG tablet  Encounter to establish care -We reviewed the PMH, PSH, FH, SH, Meds and Allergies. -We provided refills for any medications we will prescribe as needed. -We addressed current concerns per orders and patient instructions. -We have asked for records for pertinent exams, studies, vaccines and notes from previous providers. -We have advised patient to follow up per instructions below.  F/u in 1 month  Grier Mitts, MD

## 2018-08-24 ENCOUNTER — Encounter: Payer: Self-pay | Admitting: Family Medicine

## 2018-09-10 ENCOUNTER — Ambulatory Visit: Payer: Commercial Managed Care - PPO | Admitting: Cardiology

## 2018-10-21 ENCOUNTER — Ambulatory Visit: Payer: Commercial Managed Care - PPO | Admitting: Family Medicine

## 2018-10-27 ENCOUNTER — Encounter: Payer: Self-pay | Admitting: Family Medicine

## 2018-10-27 ENCOUNTER — Ambulatory Visit (INDEPENDENT_AMBULATORY_CARE_PROVIDER_SITE_OTHER): Payer: Commercial Managed Care - PPO | Admitting: Family Medicine

## 2018-10-27 VITALS — BP 116/82 | HR 102 | Temp 98.7°F | Wt 235.0 lb

## 2018-10-27 DIAGNOSIS — I10 Essential (primary) hypertension: Secondary | ICD-10-CM

## 2018-10-27 DIAGNOSIS — I5032 Chronic diastolic (congestive) heart failure: Secondary | ICD-10-CM | POA: Diagnosis not present

## 2018-10-27 NOTE — Patient Instructions (Signed)
Heart Failure Heart failure is a condition in which the heart has trouble pumping blood because it has become weak or stiff. This means that the heart does not pump blood efficiently for the body to work well. For some people with heart failure, fluid may back up into the lungs and there may be swelling (edema) in the lower legs. Heart failure is usually a long-term (chronic) condition. It is important for you to take good care of yourself and follow the treatment plan from your health care provider. What are the causes? This condition is caused by some health problems, including:  High blood pressure (hypertension). Hypertension causes the heart muscle to work harder than normal. High blood pressure eventually causes the heart to become stiff and weak.  Coronary artery disease (CAD). CAD is the buildup of cholesterol and fat (plaques) in the arteries of the heart.  Heart attack (myocardial infarction). Injured tissue, which is caused by the heart attack, does not contract as well and the heart's ability to pump blood is weakened.  Abnormal heart valves. When the heart valves do not open and close properly, the heart muscle must pump harder to keep the blood flowing.  Heart muscle disease (cardiomyopathy or myocarditis). Heart muscle disease is damage to the heart muscle from a variety of causes, such as drug or alcohol abuse, infections, or unknown causes. These can increase the risk of heart failure.  Lung disease. When the lungs do not work properly, the heart must work harder. What increases the risk? Risk of heart failure increases as a person ages. This condition is also more likely to develop in people who:  Are overweight.  Are male.  Smoke or chew tobacco.  Abuse alcohol or illegal drugs.  Have taken medicines that can damage the heart, such as chemotherapy drugs.  Have diabetes. ? High blood sugar (glucose) is associated with high fat (lipid) levels in the blood. ? Diabetes  can also damage tiny blood vessels that carry nutrients to the heart muscle.  Have abnormal heart rhythms.  Have thyroid problems.  Have low blood counts (anemia). What are the signs or symptoms? Symptoms of this condition include:  Shortness of breath with activity, such as when climbing stairs.  Persistent cough.  Swelling of the feet, ankles, legs, or abdomen.  Unexplained weight gain.  Difficulty breathing when lying flat (orthopnea).  Waking from sleep because of the need to sit up and get more air.  Rapid heartbeat.  Fatigue and loss of energy.  Feeling light-headed, dizzy, or close to fainting.  Loss of appetite.  Nausea.  Increased urination during the night (nocturia).  Confusion. How is this diagnosed? This condition is diagnosed based on:  Medical history, symptoms, and a physical exam.  Diagnostic tests, which may include: ? Echocardiogram. ? Electrocardiogram (ECG). ? Chest X-ray. ? Blood tests. ? Exercise stress test. ? Radionuclide scans. ? Cardiac catheterization and angiogram. How is this treated? Treatment for this condition is aimed at managing the symptoms of heart failure. Medicines, behavioral changes, or other treatments may be necessary to treat heart failure. Medicines These may include:  Angiotensin-converting enzyme (ACE) inhibitors. This type of medicine blocks the effects of a blood protein called angiotensin-converting enzyme. ACE inhibitors relax (dilate) the blood vessels and help to lower blood pressure.  Angiotensin receptor blockers (ARBs). This type of medicine blocks the actions of a blood protein called angiotensin. ARBs dilate the blood vessels and help to lower blood pressure.  Water pills (diuretics). Diuretics cause  the kidneys to remove salt and water from the blood. The extra fluid is removed through urination, leaving a lower volume of blood that the heart has to pump.  Beta blockers. These improve heart muscle  strength and they prevent the heart from beating too quickly.  Digoxin. This increases the force of the heartbeat. Healthy behavior changes These may include:  Reaching and maintaining a healthy weight.  Stopping smoking or chewing tobacco.  Eating heart-healthy foods.  Limiting or avoiding alcohol.  Stopping use of street drugs (illegal drugs).  Physical activity. Other treatments These may include:  Surgery to open blocked coronary arteries or repair damaged heart valves.  Placement of a biventricular pacemaker to improve heart muscle function (cardiac resynchronization therapy). This device paces both the right ventricle and left ventricle.  Placement of a device to treat serious abnormal heart rhythms (implantable cardioverter defibrillator, or ICD).  Placement of a device to improve the pumping ability of the heart (left ventricular assist device, or LVAD).  Heart transplant. This can cure heart failure, and it is considered for certain patients who do not improve with other therapies. Follow these instructions at home: Medicines  Take over-the-counter and prescription medicines only as told by your health care provider. Medicines are important in reducing the workload of your heart, slowing the progression of heart failure, and improving your symptoms. ? Do not stop taking your medicine unless your health care provider told you to do that. ? Do not skip any dose of medicine. ? Refill your prescriptions before you run out of medicine. You need your medicines every day. Eating and drinking   Eat heart-healthy foods. Talk with a dietitian to make an eating plan that is right for you. ? Choose foods that contain no trans fat and are low in saturated fat and cholesterol. Healthy choices include fresh or frozen fruits and vegetables, fish, lean meats, legumes, fat-free or low-fat dairy products, and whole-grain or high-fiber foods. ? Limit salt (sodium) if directed by your  health care provider. Sodium restriction may reduce symptoms of heart failure. Ask a dietitian to recommend heart-healthy seasonings. ? Use healthy cooking methods instead of frying. Healthy methods include roasting, grilling, broiling, baking, poaching, steaming, and stir-frying.  Limit your fluid intake if directed by your health care provider. Fluid restriction may reduce symptoms of heart failure. Lifestyle   Stop smoking or using chewing tobacco. Nicotine and tobacco can damage your heart and your blood vessels. Do not use nicotine gum or patches before talking to your health care provider.  Limit alcohol intake to no more than 1 drink per day for non-pregnant women and 2 drinks per day for men. One drink equals 12 oz of beer, 5 oz of wine, or 1 oz of hard liquor. ? Drinking more than that is harmful to your heart. Tell your health care provider if you drink alcohol several times a week. ? Talk with your health care provider about whether any level of alcohol use is safe for you. ? If your heart has already been damaged by alcohol or you have severe heart failure, drinking alcohol should be stopped completely.  Stop use of illegal drugs.  Lose weight if directed by your health care provider. Weight loss may reduce symptoms of heart failure.  Do moderate physical activity if directed by your health care provider. People who are elderly and people with severe heart failure should consult with a health care provider for physical activity recommendations. Monitor important information   Weigh  yourself every day. Keeping track of your weight daily helps you to notice excess fluid sooner. ? Weigh yourself every morning after you urinate and before you eat breakfast. ? Wear the same amount of clothing each time you weigh yourself. ? Record your daily weight. Provide your health care provider with your weight record.  Monitor and record your blood pressure as told by your health care  provider.  Check your pulse as told by your health care provider. Dealing with extreme temperatures  If the weather is extremely hot: ? Avoid vigorous physical activity. ? Use air conditioning or fans or seek a cooler location. ? Avoid caffeine and alcohol. ? Wear loose-fitting, lightweight, and light-colored clothing.  If the weather is extremely cold: ? Avoid vigorous physical activity. ? Layer your clothes. ? Wear mittens or gloves, a hat, and a scarf when you go outside. ? Avoid alcohol. General instructions  Manage other health conditions such as hypertension, diabetes, thyroid disease, or abnormal heart rhythms as told by your health care provider.  Learn to manage stress. If you need help to do this, ask your health care provider.  Plan rest periods when fatigued.  Get ongoing education and support as needed.  Participate in or seek rehabilitation as needed to maintain or improve independence and quality of life.  Stay up to date with immunizations. Keeping current on pneumococcal and influenza immunizations is especially important to prevent respiratory infections.  Keep all follow-up visits as told by your health care provider. This is important. Contact a health care provider if:  You have a rapid weight gain.  You have increasing shortness of breath that is unusual for you.  You are unable to participate in your usual physical activities.  You tire easily.  You cough more than normal, especially with physical activity.  You have any swelling or more swelling in areas such as your hands, feet, ankles, or abdomen.  You are unable to sleep because it is hard to breathe.  You feel like your heart is beating quickly (palpitations).  You become dizzy or light-headed when you stand up. Get help right away if:  You have difficulty breathing.  You notice or your family notices a change in your awareness, such as having trouble staying awake or having difficulty  with concentration.  You have pain or discomfort in your chest.  You have an episode of fainting (syncope). This information is not intended to replace advice given to you by your health care provider. Make sure you discuss any questions you have with your health care provider. Document Released: 10/06/2005 Document Revised: 09/04/2017 Document Reviewed: 04/30/2016 Elsevier Interactive Patient Education  2019 [employer].  Managing Your Hypertension Hypertension is commonly called high blood pressure. This is when the force of your blood pressing against the walls of your arteries is too strong. Arteries are blood vessels that carry blood from your heart throughout your body. Hypertension forces the heart to work harder to pump blood, and may cause the arteries to become narrow or stiff. Having untreated or uncontrolled hypertension can cause heart attack, stroke, kidney disease, and other problems. What are blood pressure readings? A blood pressure reading consists of a higher number over a lower number. Ideally, your blood pressure should be below 120/80. The first ("top") number is called the systolic pressure. It is a measure of the pressure in your arteries as your heart beats. The second ("bottom") number is called the diastolic pressure. It is a measure of the pressure  in your arteries as the heart relaxes. What does my blood pressure reading mean? Blood pressure is classified into four stages. Based on your blood pressure reading, your health care provider may use the following stages to determine what type of treatment you need, if any. Systolic pressure and diastolic pressure are measured in a unit called mm Hg. Normal  Systolic pressure: below 161.  Diastolic pressure: below 80. Elevated  Systolic pressure: 096-045.  Diastolic pressure: below 80. Hypertension stage 1  Systolic pressure: 409-811.  Diastolic pressure: 91-47. Hypertension stage 2  Systolic pressure: 829 or  above.  Diastolic pressure: 90 or above. What health risks are associated with hypertension? Managing your hypertension is an important responsibility. Uncontrolled hypertension can lead to:  A heart attack.  A stroke.  A weakened blood vessel (aneurysm).  Heart failure.  Kidney damage.  Eye damage.  Metabolic syndrome.  Memory and concentration problems. What changes can I make to manage my hypertension? Hypertension can be managed by making lifestyle changes and possibly by taking medicines. Your health care provider will help you make a plan to bring your blood pressure within a normal range. Eating and drinking   Eat a diet that is high in fiber and potassium, and low in salt (sodium), added sugar, and fat. An example eating plan is called the DASH (Dietary Approaches to Stop Hypertension) diet. To eat this way: ? Eat plenty of fresh fruits and vegetables. Try to fill half of your plate at each meal with fruits and vegetables. ? Eat whole grains, such as whole wheat pasta, brown rice, or whole grain bread. Fill about one quarter of your plate with whole grains. ? Eat low-fat diary products. ? Avoid fatty cuts of meat, processed or cured meats, and poultry with skin. Fill about one quarter of your plate with lean proteins such as fish, chicken without skin, beans, eggs, and tofu. ? Avoid premade and processed foods. These tend to be higher in sodium, added sugar, and fat.  Reduce your daily sodium intake. Most people with hypertension should eat less than 1,500 mg of sodium a day.  Limit alcohol intake to no more than 1 drink a day for nonpregnant women and 2 drinks a day for men. One drink equals 12 oz of beer, 5 oz of wine, or 1 oz of hard liquor. Lifestyle  Work with your health care provider to maintain a healthy body weight, or to lose weight. Ask what an ideal weight is for you.  Get at least 30 minutes of exercise that causes your heart to beat faster (aerobic  exercise) most days of the week. Activities may include walking, swimming, or biking.  Include exercise to strengthen your muscles (resistance exercise), such as weight lifting, as part of your weekly exercise routine. Try to do these types of exercises for 30 minutes at least 3 days a week.  Do not use any products that contain nicotine or tobacco, such as cigarettes and e-cigarettes. If you need help quitting, ask your health care provider.  Control any long-term (chronic) conditions you have, such as high cholesterol or diabetes. Monitoring  Monitor your blood pressure at home as told by your health care provider. Your personal target blood pressure may vary depending on your medical conditions, your age, and other factors.  Have your blood pressure checked regularly, as often as told by your health care provider. Working with your health care provider  Review all the medicines you take with your health care provider because  there may be side effects or interactions.  Talk with your health care provider about your diet, exercise habits, and other lifestyle factors that may be contributing to hypertension.  Visit your health care provider regularly. Your health care provider can help you create and adjust your plan for managing hypertension. Will I need medicine to control my blood pressure? Your health care provider may prescribe medicine if lifestyle changes are not enough to get your blood pressure under control, and if:  Your systolic blood pressure is 130 or higher.  Your diastolic blood pressure is 80 or higher. Take medicines only as told by your health care provider. Follow the directions carefully. Blood pressure medicines must be taken as prescribed. The medicine does not work as well when you skip doses. Skipping doses also puts you at risk for problems. Contact a health care provider if:  You think you are having a reaction to medicines you have taken.  You have repeated  (recurrent) headaches.  You feel dizzy.  You have swelling in your ankles.  You have trouble with your vision. Get help right away if:  You develop a severe headache or confusion.  You have unusual weakness or numbness, or you feel faint.  You have severe pain in your chest or abdomen.  You vomit repeatedly.  You have trouble breathing. Summary  Hypertension is when the force of blood pumping through your arteries is too strong. If this condition is not controlled, it may put you at risk for serious complications.  Your personal target blood pressure may vary depending on your medical conditions, your age, and other factors. For most people, a normal blood pressure is less than 120/80.  Hypertension is managed by lifestyle changes, medicines, or both. Lifestyle changes include weight loss, eating a healthy, low-sodium diet, exercising more, and limiting alcohol. This information is not intended to replace advice given to you by your health care provider. Make sure you discuss any questions you have with your health care provider. Document Released: 06/30/2012 Document Revised: 09/03/2016 Document Reviewed: 09/03/2016 Elsevier Interactive Patient Education  2019 [employer].

## 2018-10-27 NOTE — Progress Notes (Signed)
Subjective:    Patient ID: Curtis Patton, male    DOB: 12-Oct-1966, 53 y.o.   MRN: [id number]  No chief complaint on file.   HPI Patient was seen today for follow-up.  Pt states since last visit, endorses compliance with HTN and CHF meds.  Pt endorses feeling better.  Was 267 lbs when admitted to the hospital 08/2018.  Endorses weight loss, eating better, able to move better.  In the past would become short of breath when tying shoes.  Denies SOB, CP, LE edema, cough, DOE.  Has f/u with Cardiology on Monday.  Of note, states pharmacy only giving him 2-week supply Coreg at the time.  Past Medical History:  Diagnosis Date  . (HFpEF) heart failure with preserved ejection fraction ([place])    a. Echo 08/10/2018: LVEF 50-55% w/ mild LVH, mild diffuse hypokinesis w/ no regional variation, and G1DD  . Acute CHF ([place])   . Acute renal insufficiency   . Acute respiratory failure with hypoxia ([place])   . Hypertension   . Migraine     Allergies  Allergen Reactions  . Contrast Media [Iodinated Diagnostic Agents]     ROS General: Denies fever, chills, night sweats, changes in weight, changes in appetite HEENT: Denies headaches, ear pain, changes in vision, rhinorrhea, sore throat CV: Denies CP, palpitations, SOB, orthopnea Pulm: Denies SOB, cough, wheezing GI: Denies abdominal pain, nausea, vomiting, diarrhea, constipation GU: Denies dysuria, hematuria, frequency, vaginal discharge Msk: Denies muscle cramps, joint pains Neuro: Denies weakness, numbness, tingling Skin: Denies rashes, bruising Psych: Denies depression, anxiety, hallucinations    Objective:    Blood pressure 116/82, pulse (!) 102, temperature 98.7 F (37.1 C), temperature source Oral, weight 235 lb (106.6 kg), SpO2 98 %.  Gen. Pleasant, well-nourished, in no distress, normal affect   HEENT: [place]/AT, face symmetric, no scleral icterus, PERRLA, nares patent without drainage, pharynx without erythema or exudate.  No carotid  bruits Lungs: no accessory muscle use, CTAB, no wheezes or rales Cardiovascular: Tachycardic, no m/r/g, no peripheral edema Abdomen: BS present, soft, NT/ND, no hepatosplenomegaly. Neuro:  A&Ox3, CN II-XII intact, normal gait Skin:  Warm, no lesions/ rash  Wt Readings from Last 3 Encounters:  10/27/18 235 lb (106.6 kg)  08/19/18 257 lb (116.6 kg)  08/11/18 251 lb 11.2 oz (114.2 kg)    Lab Results  Component Value Date   WBC 10.8 (H) 08/19/2018   HGB 15.8 08/19/2018   HCT 45.9 08/19/2018   PLT 310.0 08/19/2018   GLUCOSE 79 08/19/2018   ALT 25 02/17/2013   AST 17 02/17/2013   NA 139 08/19/2018   K 4.4 08/19/2018   CL 104 08/19/2018   CREATININE 1.22 08/19/2018   BUN 12 08/19/2018   CO2 29 08/19/2018   TSH 0.22 (L) 08/19/2018    Assessment/Plan:  Chronic diastolic congestive heart failure (HCC) -stable -continue daily weights and sodium restriction -continue Norvasc 10 mg, coreg 3.125 BID, losartan 25 mg, and lasix 20 mg -encouraged to keep f/u with Cardiology next wk.  Essential hypertension  -controlled -Continue current medications including Norvasc 10 mg, Coreg 3.125 mg twice daily, losartan 25 mg, and Lasix 20 mg -Discussed lifestyle modifications.  Patient encouraged to continue sodium restriction -Pt encouraged to check BP at home regularly  F/u prn in the next few months  Grier Mitts, MD

## 2018-10-31 ENCOUNTER — Encounter: Payer: Self-pay | Admitting: Family Medicine

## 2018-11-01 ENCOUNTER — Ambulatory Visit: Payer: Commercial Managed Care - PPO | Admitting: Cardiology

## 2018-11-01 DIAGNOSIS — R0989 Other specified symptoms and signs involving the circulatory and respiratory systems: Secondary | ICD-10-CM

## 2018-11-02 ENCOUNTER — Encounter: Payer: Self-pay | Admitting: Cardiology

## 2019-01-26 ENCOUNTER — Ambulatory Visit (INDEPENDENT_AMBULATORY_CARE_PROVIDER_SITE_OTHER): Payer: Commercial Managed Care - PPO | Admitting: Family Medicine

## 2019-01-26 ENCOUNTER — Other Ambulatory Visit: Payer: Self-pay

## 2019-01-26 ENCOUNTER — Encounter: Payer: Self-pay | Admitting: Family Medicine

## 2019-01-26 DIAGNOSIS — I5032 Chronic diastolic (congestive) heart failure: Secondary | ICD-10-CM

## 2019-01-26 DIAGNOSIS — I10 Essential (primary) hypertension: Secondary | ICD-10-CM

## 2019-01-26 NOTE — Progress Notes (Signed)
Virtual Visit via Telephone Note  I connected with Curtis Patton on 01/26/19 at  2:30 PM EDT by telephone and verified that I am speaking with the correct person using two identifiers.   I discussed the limitations, risks, security and privacy concerns of performing an evaluation and management service by telephone and the availability of in person appointments. I also discussed with the patient that there may be a patient responsible charge related to this service. The patient expressed understanding and agreed to proceed.  Location patient: home Location provider: work office Participants present for the call: patient, provider Patient did not have a visit in the prior 7 days to address this/these issue(s).   History of Present Illness: Pt following up on chronic conditions.  Overall states is doing well, denies current issues.  Does not need med refills at this time.  CHF- pt taking meds.  Denies LE edema, SOB, CP, dyspnea.  Working on diet.  Has reduced sodium intake.  Endorses weight loss, now 219 lbs.  HTN-  Taking meds.  Has not gotten bp cuff yet.  States will try to get one this wk.  Eating better.  Pt denies HAs, CP, changes in vision.   Observations/Objective: Patient sounds cheerful and well on the phone. I do not appreciate any SOB. Speech and thought processing are grossly intact. Patient reported vitals:  Wt 219 lbs  Assessment and Plan: HTN -continue current meds -pt encouraged to obtain bp cuff  HFpEF -stable -continue current meds -continue daily wts, sodium restriction, and fluid restriction  Follow Up Instructions: F/u in the next 3 months, sooner if needed.  I did not refer this patient for an OV in the next 24 hours for this/these issue(s).  I discussed the assessment and treatment plan with the patient. The patient was provided an opportunity to ask questions and all were answered. The patient agreed with the plan and demonstrated an understanding  of the instructions.   The patient was advised to call back or seek an in-person evaluation if the symptoms worsen or if the condition fails to improve as anticipated.  I provided 12 minutes of non-face-to-face time during this encounter.   Billie Ruddy, MD

## 2019-05-30 ENCOUNTER — Other Ambulatory Visit: Payer: Self-pay

## 2019-05-30 ENCOUNTER — Telehealth (INDEPENDENT_AMBULATORY_CARE_PROVIDER_SITE_OTHER): Payer: Commercial Managed Care - PPO | Admitting: Family Medicine

## 2019-05-30 DIAGNOSIS — I5032 Chronic diastolic (congestive) heart failure: Secondary | ICD-10-CM

## 2019-05-30 DIAGNOSIS — I10 Essential (primary) hypertension: Secondary | ICD-10-CM

## 2019-05-30 NOTE — Progress Notes (Signed)
Virtual Visit via Video Note  I connected with@ on 05/30/19 at  9:00 AM EDT by a video enabled telemedicine application 2/2 [id number] pandemic and verified that I am speaking with the correct person using two identifiers.  Location patient: home Location provider:work or home office Persons participating in the virtual visit: patient, provider  I discussed the limitations of evaluation and management by telemedicine and the availability of in person appointments. The patient expressed understanding and agreed to proceed.   HPI: Pt requesting refills, but states he has several refills on the bottles.  Taking Norvasc 10 mg, coreg 3.125 BID, and losartan 25 mg.  Pt is not taking lasix.  States bp was 139/97 this am.  Notes lower bp in afternoon.  Takes meds around 10 am after eating breakfast.  Had sausage for breakfast this wk.  Walking for exercise.  Drinking more water.  Eating more baked foods, may use air fryer.  States weight is around 225 lbs.  Pt denies SOB or LE edema.  Pt did not go to the Cardiologist.  Is interested in trying to reschedule that appointment.   ROS: See pertinent positives and negatives per HPI.  Past Medical History:  Diagnosis Date  . (HFpEF) heart failure with preserved ejection fraction ([place])    a. Echo 08/10/2018: LVEF 50-55% w/ mild LVH, mild diffuse hypokinesis w/ no regional variation, and G1DD  . Acute CHF ([place])   . Acute renal insufficiency   . Acute respiratory failure with hypoxia ([place])   . Hypertension   . Migraine     Past Surgical History:  Procedure Laterality Date  . JOINT REPLACEMENT    . neck    . ROTATOR CUFF REPAIR    . SPINE SURGERY      Family History  Problem Relation Age of Onset  . Hypertension Other   . Hypertension Maternal Grandmother   . Sudden death Paternal 23        Died suddenly in her mid-50s. Unsure of cause.     Current Outpatient Medications:  .  amLODipine (NORVASC) 10 MG tablet, Take 1 tablet (10 mg  total) by mouth daily., Disp: 90 tablet, Rfl: 3 .  aspirin-acetaminophen-caffeine (EXCEDRIN MIGRAINE) 250-250-65 MG per tablet, Take 1 tablet by mouth every 6 (six) hours as needed for headache., Disp: , Rfl:  .  carvedilol (COREG) 3.125 MG tablet, Take 1 tablet (3.125 mg total) by mouth 2 (two) times daily with a meal., Disp: 180 tablet, Rfl: 3 .  furosemide (LASIX) 20 MG tablet, Take 1 tablet (20 mg total) by mouth daily., Disp: 30 tablet, Rfl: 2 .  losartan (COZAAR) 25 MG tablet, Take 1 tablet (25 mg total) by mouth daily., Disp: 90 tablet, Rfl: 3  EXAM:  VITALS per patient if applicable: wt 454 lbs, RR between 12-20 bpm  GENERAL: alert, oriented, appears well and in no acute distress  HEENT: atraumatic, conjunctiva clear, no obvious abnormalities on inspection of external nose and ears  NECK: normal movements of the head and neck  LUNGS: on inspection no signs of respiratory distress, breathing rate appears normal, no obvious gross SOB, gasping or wheezing  CV: no obvious cyanosis  MS: moves all visible extremities without noticeable abnormality  PSYCH/NEURO: pleasant and cooperative, no obvious depression or anxiety, speech and thought processing grossly intact  ASSESSMENT AND PLAN:  Discussed the following assessment and plan:  Essential hypertension  -elevated -discussed the importance of lifestyle modifications -continue current meds: norvasc 10 mg, losartan 25 mg,  and coreg 3.125 mg -will f/u in 1 month.  If bp continues to be elevated will increase dose of losartan  Chronic diastolic heart failure ([place])  -discussed lifestyle modifications -continue daily weights.  Pt to notify clinic for increased wt -continue current meds - Plan: Ambulatory referral to Cardiology  F/u in 1 month.  Will send in med refills at that time as pt has several refills currently.   I discussed the assessment and treatment plan with the patient. The patient was provided an opportunity to  ask questions and all were answered. The patient agreed with the plan and demonstrated an understanding of the instructions.   The patient was advised to call back or seek an in-person evaluation if the symptoms worsen or if the condition fails to improve as anticipated.   Billie Ruddy, MD

## 2019-06-21 NOTE — Progress Notes (Signed)
Cardiology Office Note:   Date:  06/22/2019  NAME:  Curtis Patton    MRN: [id number] DOB:  March 23, 1966   PCP:  Billie Ruddy, MD  Cardiologist:  Evalina Field, MD  Electrophysiologist:  None   Referring MD: Billie Ruddy, MD   Chief Complaint  Patient presents with  . Congestive Heart Failure   History of Present Illness:   Curtis Patton is a 53 y.o. male with a hx of hypertension who is being seen today for the evaluation of diastolic heart failure at the request of Billie Ruddy, MD.  He presents for the evaluation of diastolic heart failure.  He was seen in the emergency room at the beginning of this year with symptoms of shortness of breath and systolic blood pressure in the 200 range.  His BNP was elevated in the 600 range.  It appears to be consistent with a hypertensive crisis.  He was started on blood pressure medications and was taking Lasix as needed.  He reports he is stopped taking Lasix at this time.  He states he is doing quite well since that diagnosis.  His blood pressure is still quite elevated in the 150/100 range.  He is on a very low dose of his medications.  He states he can walk up to 1 mile daily with his dog.  He experiences no shortness of breath or chest pain.  He reports he could walk further but just chooses not to.  He reports no lower extremity edema, PND, orthopnea.  Overall, he appears to be doing quite well.  He does have a history of smoking, but quit smoking cigarettes nearly 2 years ago.  He does smoke cigars daily.  He reports he may drink a beer once a day.  He states he is lost significant weight, nearly 40 pounds since his visit to the hospital in January.  There is no recent lipid profile or A1c that I can see.  He also reports difficulties maintaining an erection, and request viagra.   Past Medical History: Past Medical History:  Diagnosis Date  . (HFpEF) heart failure with preserved ejection fraction ([place])    a. Echo 08/10/2018:  LVEF 50-55% w/ mild LVH, mild diffuse hypokinesis w/ no regional variation, and G1DD  . Acute CHF ([place])   . Acute renal insufficiency   . Acute respiratory failure with hypoxia ([place])   . Hypertension   . Migraine     Past Surgical History: Past Surgical History:  Procedure Laterality Date  . JOINT REPLACEMENT    . neck    . ROTATOR CUFF REPAIR    . SPINE SURGERY      Current Medications: Current Meds  Medication Sig  . amLODipine (NORVASC) 10 MG tablet Take 1 tablet (10 mg total) by mouth daily.  Marland Kitchen aspirin-acetaminophen-caffeine (EXCEDRIN MIGRAINE) 250-250-65 MG per tablet Take 1 tablet by mouth every 6 (six) hours as needed for headache.  . carvedilol (COREG) 3.125 MG tablet Take 1 tablet (3.125 mg total) by mouth 2 (two) times daily with a meal.  . losartan (COZAAR) 50 MG tablet Take 1 tablet (50 mg total) by mouth daily.  . [DISCONTINUED] losartan (COZAAR) 25 MG tablet Take 1 tablet (25 mg total) by mouth daily.     Allergies:    Contrast media [iodinated diagnostic agents]   Social History: Social History   Socioeconomic History  . Marital status: Married    Spouse name: Not on file  . Number of  children: Not on file  . Years of education: Not on file  . Highest education level: Not on file  Occupational History  . Not on file  Social Needs  . Financial resource strain: Not on file  . Food insecurity    Worry: Not on file    Inability: Not on file  . Transportation needs    Medical: Not on file    Non-medical: Not on file  Tobacco Use  . Smoking status: Heavy Tobacco Smoker    Packs/day: 1.00    Types: Cigars  . Smokeless tobacco: Never Used  Substance and Sexual Activity  . Alcohol use: Yes    Comment: rarely  . Drug use: No  . Sexual activity: Not Currently    Partners: Female  Lifestyle  . Physical activity    Days per week: Not on file    Minutes per session: Not on file  . Stress: Not on file  Relationships  . Social Herbalist on  phone: Not on file    Gets together: Not on file    Attends religious service: Not on file    Active member of club or organization: Not on file    Attends meetings of clubs or organizations: Not on file    Relationship status: Not on file  Other Topics Concern  . Not on file  Social History Narrative  . Not on file     Family History: The patient's family history includes Hypertension in his maternal grandmother, mother, and another family member; Sudden death in his paternal grandmother.  ROS:   All other ROS reviewed and negative. Pertinent positives noted in the HPI.     EKGs/Labs/Other Studies Reviewed:   The following studies were personally reviewed by me today:  TTE 06/22/2019 Left ventricle: The cavity size was normal. There was mild   concentric hypertrophy. Systolic function was at the lower limits   of normal. The estimated ejection fraction was in the range of   50% to 55%. Mild diffuse hypokinesis with no identifiable   regional variations. Doppler parameters are consistent with   abnormal left ventricular relaxation (grade 1 diastolic   dysfunction).  EKG:  EKG is ordered today.  The ekg ordered today demonstrates normal sinus rhythm, heart rate 77, normal intervals, possible left axis deviation, no acute ischemic changes, no prior infarction, and was personally reviewed by me.   Recent Labs: 08/09/2018: B Natriuretic Peptide 639.7 08/19/2018: BUN 12; Creatinine, Ser 1.22; Hemoglobin 15.8; Platelets 310.0; Potassium 4.4; Sodium 139; TSH 0.22   Recent Lipid Panel No results found for: CHOL, TRIG, HDL, CHOLHDL, VLDL, LDLCALC, LDLDIRECT  Physical Exam:   VS:  BP (!) 156/102   Pulse 72   Ht 6\' 2"  (1.88 m)   Wt 226 lb (102.5 kg)   BMI 29.02 kg/m    Wt Readings from Last 3 Encounters:  06/22/19 226 lb (102.5 kg)  10/27/18 235 lb (106.6 kg)  08/19/18 257 lb (116.6 kg)    General: Well nourished, well developed, in no acute distress Heart: Atraumatic, normal  size  Eyes: PEERLA, EOMI  Neck: Supple, no JVD Endocrine: No thryomegaly Cardiac: Normal S1, S2; RRR; no murmurs, rubs, or gallops Lungs: Clear to auscultation bilaterally, no wheezing, rhonchi or rales  Abd: Soft, nontender, no hepatomegaly  Ext: No edema, pulses 2+ Musculoskeletal: No deformities, BUE and BLE strength normal and equal Skin: Warm and dry, no rashes   Neuro: Alert and oriented to person, place,  time, and situation, CNII-XII grossly intact, no focal deficits  Psych: Normal mood and affect   ASSESSMENT:   NAME@ is a 53 y.o. male who presents for the following: 1. Chronic heart failure with preserved ejection fraction ([place])   2. Essential hypertension   3. Mixed hyperlipidemia   4. Tobacco abuse   5. Erectile dysfunction, unspecified erectile dysfunction type   6. Acute on chronic diastolic CHF (congestive heart failure) ([place])     PLAN:   1. Chronic heart failure with preserved ejection fraction ([facility]) -This is strictly related to his hypertensive crisis when he was in the emergency room -He has no clinical evidence of heart failure on my examination -His blood pressure is the mainstay treatment of this as well as cessation from tobacco products -We will increase his losartan to 50 mg daily for better control, he will continue his current dose of amlodipine and Coreg -I will check a full lab profile on him, CMP, A1c, lipid profile, TSH as he had not been done recently -He has no need for daily Lasix and does not take this  2. Essential hypertension -Increase losartan to 50 mg as above  3. Mixed hyperlipidemia -We will check his lipid profile today  4. Tobacco abuse -Advised to refrain from tobacco use including cigars.  He reports he will quit  5. Erectile dysfunction, unspecified erectile dysfunction type -I suspect this is related to long-term tobacco abuse.  Given that he is able to exercise for nearly 1 mile without cardiovascular symptoms, I believe his  heart is safe for the use of Viagra. -We will prescribe this for him today and he will continue to quit smoking -We will consider a PAD screen on him given his significant history of smoking  Disposition: Return in about 6 months (around 12/20/2019).  Medication Adjustments/Labs and Tests Ordered: Current medicines are reviewed at length with the patient today.  Concerns regarding medicines are outlined above.  Orders Placed This Encounter  Procedures  . Comprehensive metabolic panel  . Hemoglobin A1c  . TSH  . Lipid panel  . EKG 12-Lead   Meds ordered this encounter  Medications  . losartan (COZAAR) 50 MG tablet    Sig: Take 1 tablet (50 mg total) by mouth daily.    Dispense:  90 tablet    Refill:  3  . sildenafil (VIAGRA) 25 MG tablet    Sig: Take 1 tablet (25 mg total) by mouth daily as needed for erectile dysfunction.    Dispense:  15 tablet    Refill:  1    Patient Instructions  Medication Instructions:  Increase Losartan to 50 mg daily. (you may take 2 tablets of the 25 mg at home until you run out) If you need a refill on your cardiac medications before your next appointment, please call your pharmacy.   Lab work: CMP, A1C, TSH, LIPID today If you have labs (blood work) drawn today and your tests are completely normal, you will receive your results only by: Marland Kitchen MyChart Message (if you have MyChart) OR . A paper copy in the mail If you have any lab test that is abnormal or we need to change your treatment, we will call you to review the results.  Follow-Up: At [facility], you and your health needs are our priority.  As part of our continuing mission to provide you with exceptional heart care, we have created designated Provider Care Teams.  These Care Teams include your primary Cardiologist (physician) and Advanced  Practice Providers (APPs -  Physician Assistants and Nurse Practitioners) who all work together to provide you with the care you need, when you need it. You  will need a follow up appointment in 6 months.  Please call our office 2 months in advance to schedule this appointment.  You may see Evalina Field, MD or one of the following Advanced Practice Providers on your designated Care Team: [place], [place] . Fabian Sharp, PA-C       Signed, Addison Naegeli. Audie Box, [place]  [address] [place], [place] [address] [phone]  06/22/2019 9:53 AM

## 2019-06-22 ENCOUNTER — Ambulatory Visit (INDEPENDENT_AMBULATORY_CARE_PROVIDER_SITE_OTHER): Payer: Commercial Managed Care - PPO | Admitting: Cardiovascular Disease

## 2019-06-22 ENCOUNTER — Encounter: Payer: Self-pay | Admitting: Cardiovascular Disease

## 2019-06-22 ENCOUNTER — Other Ambulatory Visit: Payer: Self-pay

## 2019-06-22 VITALS — BP 156/102 | HR 72 | Ht 74.0 in | Wt 226.0 lb

## 2019-06-22 DIAGNOSIS — Z720 Tobacco use: Secondary | ICD-10-CM

## 2019-06-22 DIAGNOSIS — I5032 Chronic diastolic (congestive) heart failure: Secondary | ICD-10-CM | POA: Diagnosis not present

## 2019-06-22 DIAGNOSIS — I10 Essential (primary) hypertension: Secondary | ICD-10-CM | POA: Diagnosis not present

## 2019-06-22 DIAGNOSIS — I5033 Acute on chronic diastolic (congestive) heart failure: Secondary | ICD-10-CM

## 2019-06-22 DIAGNOSIS — N529 Male erectile dysfunction, unspecified: Secondary | ICD-10-CM

## 2019-06-22 DIAGNOSIS — E782 Mixed hyperlipidemia: Secondary | ICD-10-CM

## 2019-06-22 MED ORDER — LOSARTAN POTASSIUM 50 MG PO TABS: 50.0000 mg | ORAL_TABLET | Freq: Every day | ORAL | 3 refills | Status: DC

## 2019-06-22 MED ORDER — SILDENAFIL CITRATE 25 MG PO TABS: 25.0000 mg | ORAL_TABLET | Freq: Every day | ORAL | 1 refills | Status: DC | PRN

## 2019-06-22 NOTE — Patient Instructions (Signed)
Medication Instructions:  Increase Losartan to 50 mg daily. (you may take 2 tablets of the 25 mg at home until you run out) If you need a refill on your cardiac medications before your next appointment, please call your pharmacy.   Lab work: CMP, A1C, TSH, LIPID today If you have labs (blood work) drawn today and your tests are completely normal, you will receive your results only by: Marland Kitchen MyChart Message (if you have MyChart) OR . A paper copy in the mail If you have any lab test that is abnormal or we need to change your treatment, we will call you to review the results.  Follow-Up: At [facility], you and your health needs are our priority.  As part of our continuing mission to provide you with exceptional heart care, we have created designated Provider Care Teams.  These Care Teams include your primary Cardiologist (physician) and Advanced Practice Providers (APPs -  Physician Assistants and Nurse Practitioners) who all work together to provide you with the care you need, when you need it. You will need a follow up appointment in 6 months.  Please call our office 2 months in advance to schedule this appointment.  You may see Evalina Field, MD or one of the following Advanced Practice Providers on your designated Care Team: [place], [place] . Fabian Sharp, PA-C

## 2019-06-23 LAB — COMPREHENSIVE METABOLIC PANEL
ALT: 26 IU/L (ref 0–44)
AST: 18 IU/L (ref 0–40)
Albumin/Globulin Ratio: 1.7 (ref 1.2–2.2)
Albumin: 4.4 g/dL (ref 3.8–4.9)
Alkaline Phosphatase: 92 IU/L (ref 39–117)
BUN/Creatinine Ratio: 9 (ref 9–20)
BUN: 8 mg/dL (ref 6–24)
Bilirubin Total: 0.9 mg/dL (ref 0.0–1.2)
CO2: 21 mmol/L (ref 20–29)
Calcium: 9.6 mg/dL (ref 8.7–10.2)
Chloride: 104 mmol/L (ref 96–106)
Creatinine, Ser: 0.91 mg/dL (ref 0.76–1.27)
GFR calc Af Amer: 111 mL/min/{1.73_m2} (ref >59)
GFR calc non Af Amer: 96 mL/min/{1.73_m2} (ref >59)
Globulin, Total: 2.6 g/dL (ref 1.5–4.5)
Glucose: 76 mg/dL (ref 65–99)
Potassium: 4.4 mmol/L (ref 3.5–5.2)
Sodium: 138 mmol/L (ref 134–144)
Total Protein: 7 g/dL (ref 6.0–8.5)

## 2019-06-23 LAB — HEMOGLOBIN A1C
Est. average glucose Bld gHb Est-mCnc: 105 mg/dL
Hgb A1c MFr Bld: 5.3 % (ref 4.8–5.6)

## 2019-06-23 LAB — LIPID PANEL
Chol/HDL Ratio: 4.5 ratio (ref 0.0–5.0)
Cholesterol, Total: 203 mg/dL — ABNORMAL HIGH (ref 100–199)
HDL: 45 mg/dL (ref >39)
LDL Chol Calc (NIH): 136 mg/dL — ABNORMAL HIGH (ref 0–99)
Triglycerides: 124 mg/dL (ref 0–149)
VLDL Cholesterol Cal: 22 mg/dL (ref 5–40)

## 2019-06-23 LAB — TSH: TSH: 0.727 u[IU]/mL (ref 0.450–4.500)

## 2019-08-22 ENCOUNTER — Other Ambulatory Visit: Payer: Self-pay | Admitting: Family Medicine

## 2019-08-22 DIAGNOSIS — I10 Essential (primary) hypertension: Secondary | ICD-10-CM

## 2019-08-22 DIAGNOSIS — I5033 Acute on chronic diastolic (congestive) heart failure: Secondary | ICD-10-CM

## 2019-10-12 ENCOUNTER — Ambulatory Visit: Payer: Commercial Managed Care - PPO | Attending: Internal Medicine

## 2019-10-12 DIAGNOSIS — Z20822 Contact with and (suspected) exposure to covid-19: Secondary | ICD-10-CM

## 2019-10-14 LAB — NOVEL CORONAVIRUS, NAA: SARS-CoV-2, NAA: NOT DETECTED

## 2019-10-19 ENCOUNTER — Encounter: Payer: Commercial Managed Care - PPO | Admitting: Family Medicine

## 2019-10-25 ENCOUNTER — Other Ambulatory Visit: Payer: Self-pay

## 2019-10-26 ENCOUNTER — Ambulatory Visit (INDEPENDENT_AMBULATORY_CARE_PROVIDER_SITE_OTHER): Payer: BC Managed Care – PPO | Admitting: Family Medicine

## 2019-10-26 ENCOUNTER — Encounter: Payer: Self-pay | Admitting: Gastroenterology

## 2019-10-26 ENCOUNTER — Encounter: Payer: Self-pay | Admitting: Family Medicine

## 2019-10-26 VITALS — BP 138/90 | HR 88 | Temp 96.4°F | Resp 12 | Ht 74.0 in | Wt 223.0 lb

## 2019-10-26 DIAGNOSIS — Z0000 Encounter for general adult medical examination without abnormal findings: Secondary | ICD-10-CM | POA: Diagnosis not present

## 2019-10-26 DIAGNOSIS — Z125 Encounter for screening for malignant neoplasm of prostate: Secondary | ICD-10-CM

## 2019-10-26 DIAGNOSIS — Z23 Encounter for immunization: Secondary | ICD-10-CM | POA: Diagnosis not present

## 2019-10-26 DIAGNOSIS — Z1211 Encounter for screening for malignant neoplasm of colon: Secondary | ICD-10-CM

## 2019-10-26 DIAGNOSIS — I5033 Acute on chronic diastolic (congestive) heart failure: Secondary | ICD-10-CM

## 2019-10-26 DIAGNOSIS — I10 Essential (primary) hypertension: Secondary | ICD-10-CM | POA: Diagnosis not present

## 2019-10-26 LAB — PSA: PSA: 0.39 ng/mL (ref 0.10–4.00)

## 2019-10-26 MED ORDER — CARVEDILOL 3.125 MG PO TABS: 3.1250 mg | ORAL_TABLET | Freq: Two times a day (BID) | ORAL | 1 refills | Status: DC

## 2019-10-26 MED ORDER — AMLODIPINE BESYLATE 10 MG PO TABS: 10.0000 mg | ORAL_TABLET | Freq: Every day | ORAL | 1 refills | Status: DC

## 2019-10-26 NOTE — Progress Notes (Signed)
HPI:  Mr. Curtis Patton is a 54 y.o.male here today for his routine physical examination. Dr Volanda Napoleon' pt, he needed a physical for work.  Last CPE: Over a year ago. He lives with his mother.  Regular exercise 3 or more times per week: He does not but considers himself active at work. Following a healthy diet: He cooks at home, in general he tries to watch what he eats.  Chronic medical problems: HTN, CHF,ED,and tobacco use.  Hx of STD's: Denies.  Immunization History  Administered Date(s) Administered  . Influenza,inj,Quad PF,6+ Mos 08/11/2018, 07/17/2019  . Pneumococcal Polysaccharide-23 10/26/2019  . Tdap 06/12/2016   Last colon cancer screening: Never. Last prostate ca screening: Never. Nocturia x 1 for years. It has improved with OTC supplements. -Denies high alcohol intake or Hx of illicit drug use. + Smoker, cigars 2-3 times per month.  -Concerns and/or follow up today:  He needs BP meds refills. He checks BP sometimes,most of the time < 140/90. He is on Amlodipine 10 mg daily,losartan 50 mg daily,and Coreg 3.125 mg bid.  Diastolic dysfunction, he follows with cardiologist. Negative for orthopnea and PND. He is not longer on Furosemide.  Echo 07/2018 LVEF 99991111, diastolic dysfunction grade I.  Lab Results  Component Value Date   CREATININE 0.91 06/22/2019   BUN 8 06/22/2019   NA 138 06/22/2019   K 4.4 06/22/2019   CL 104 06/22/2019   CO2 21 06/22/2019    Review of Systems  Constitutional: Negative for activity change, appetite change, fatigue, fever and unexpected weight change.  HENT: Negative for dental problem, nosebleeds and sore throat.   Eyes: Negative for redness and visual disturbance.  Respiratory: Negative for cough, shortness of breath and wheezing.   Cardiovascular: Negative for chest pain, palpitations and leg swelling.  Gastrointestinal: Negative for abdominal pain, blood in stool, nausea and vomiting.  Endocrine: Negative for  cold intolerance, heat intolerance, polydipsia, polyphagia and polyuria.  Genitourinary: Negative for decreased urine volume, dysuria, genital sores, hematuria and testicular pain.  Musculoskeletal: Negative for gait problem and myalgias.  Skin: Negative for color change and rash.  Allergic/Immunologic: Negative for environmental allergies.  Neurological: Negative for syncope, weakness and headaches.  Hematological: Negative for adenopathy. Does not bruise/bleed easily.  Psychiatric/Behavioral: Negative for confusion. The patient is not nervous/anxious.   All other systems reviewed and are negative.   Current Outpatient Medications on File Prior to Visit  Medication Sig Dispense Refill  . aspirin-acetaminophen-caffeine (EXCEDRIN MIGRAINE) 250-250-65 MG per tablet Take 1 tablet by mouth every 6 (six) hours as needed for headache.    . sildenafil (VIAGRA) 25 MG tablet Take 1 tablet (25 mg total) by mouth daily as needed for erectile dysfunction. 15 tablet 1  . furosemide (LASIX) 20 MG tablet Take 1 tablet (20 mg total) by mouth daily. (Patient not taking: Reported on 05/30/2019) 30 tablet 2  . losartan (COZAAR) 50 MG tablet Take 1 tablet (50 mg total) by mouth daily. 90 tablet 3   No current facility-administered medications on file prior to visit.     Past Medical History:  Diagnosis Date  . (HFpEF) heart failure with preserved ejection fraction ([place])    a. Echo 08/10/2018: LVEF 50-55% w/ mild LVH, mild diffuse hypokinesis w/ no regional variation, and G1DD  . Acute CHF ([place])   . Acute renal insufficiency   . Acute respiratory failure with hypoxia ([place])   . Hypertension   . Migraine     Past Surgical  History:  Procedure Laterality Date  . JOINT REPLACEMENT    . neck    . ROTATOR CUFF REPAIR    . SPINE SURGERY      Allergies  Allergen Reactions  . Contrast Media [Iodinated Diagnostic Agents]     Family History  Problem Relation Age of Onset  . Hypertension Other   .  Hypertension Maternal Grandmother   . Sudden death Paternal 41        Died suddenly in her mid-50s. Unsure of cause.  Marland Kitchen Hypertension Mother     Social History   Socioeconomic History  . Marital status: Married    Spouse name: Not on file  . Number of children: Not on file  . Years of education: Not on file  . Highest education level: Not on file  Occupational History  . Not on file  Tobacco Use  . Smoking status: Current Some Day Smoker    Packs/day: 1.00    Types: Cigars  . Smokeless tobacco: Never Used  Substance and Sexual Activity  . Alcohol use: Yes    Comment: rarely  . Drug use: No  . Sexual activity: Not Currently    Partners: Female  Other Topics Concern  . Not on file  Social History Narrative  . Not on file   Social Determinants of Health   Financial Resource Strain:   . Difficulty of Paying Living Expenses: Not on file  Food Insecurity:   . Worried About Charity fundraiser in the Last Year: Not on file  . Ran Out of Food in the Last Year: Not on file  Transportation Needs:   . Lack of Transportation (Medical): Not on file  . Lack of Transportation (Non-Medical): Not on file  Physical Activity:   . Days of Exercise per Week: Not on file  . Minutes of Exercise per Session: Not on file  Stress:   . Feeling of Stress : Not on file  Social Connections:   . Frequency of Communication with Friends and Family: Not on file  . Frequency of Social Gatherings with Friends and Family: Not on file  . Attends Religious Services: Not on file  . Active Member of Clubs or Organizations: Not on file  . Attends Archivist Meetings: Not on file  . Marital Status: Not on file   Vitals:   10/26/19 1055  BP: 138/90  Pulse: 88  Resp: 12  Temp: (!) 96.4 F (35.8 C)  SpO2: 100%   Body mass index is 28.63 kg/m.   Wt Readings from Last 3 Encounters:  10/26/19 223 lb (101.2 kg)  06/22/19 226 lb (102.5 kg)  10/27/18 235 lb (106.6 kg)    Physical Exam  Nursing note and vitals reviewed. Constitutional: He is oriented to person, place, and time. He appears well-developed. No distress.  HENT:  Head: Normocephalic and atraumatic.  Right Ear: Tympanic membrane, external ear and ear canal normal.  Left Ear: Tympanic membrane, external ear and ear canal normal.  Mouth/Throat: Oropharynx is clear and moist and mucous membranes are normal.  Eyes: Pupils are equal, round, and reactive to light. Conjunctivae and EOM are normal.  Neck: No tracheal deviation present. No thyromegaly present.  Cardiovascular: Normal rate and regular rhythm.  No murmur heard. Pulses:      Dorsalis pedis pulses are 2+ on the right side and 2+ on the left side.  Respiratory: Effort normal and breath sounds normal. No respiratory distress.  GI: Soft. He exhibits no mass.  There is no hepatomegaly. There is no abdominal tenderness.  Genitourinary:    Genitourinary Comments: No concerns.   Musculoskeletal:        General: No tenderness or edema.     Cervical back: Normal range of motion.     Comments: No major deformities appreciated and no signs of synovitis.  Lymphadenopathy:    He has no cervical adenopathy.       Right: No supraclavicular adenopathy present.       Left: No supraclavicular adenopathy present.  Neurological: He is alert and oriented to person, place, and time. He has normal strength. No cranial nerve deficit or sensory deficit. Coordination and gait normal.  Reflex Scores:      Bicep reflexes are 2+ on the right side and 2+ on the left side.      Patellar reflexes are 2+ on the right side and 2+ on the left side. Skin: Skin is warm. No erythema.  Psychiatric: He has a normal mood and affect. Cognition and memory are normal.  Well groomed,good eye contact.    ASSESSMENT AND PLAN:  Mr. Cejay was seen today for annual exam.  Diagnoses and all orders for this visit:  Orders Placed This Encounter  Procedures  . Pneumococcal  polysaccharide vaccine 23-valent greater than or equal to 2yo subcutaneous/IM  . PSA(Must document that pt has been informed of limitations of PSA testing.)  . Ambulatory referral to Gastroenterology   Lab Results  Component Value Date   PSA 0.39 10/26/2019   Routine general medical examination at a health care facility We discussed the importance of regular physical activity and healthy diet for prevention of chronic illness and/or complications. Preventive guidelines reviewed. Vaccination updated. Smoking cessation encouraged.  Next CPE in a year.  Colon cancer screening -     Ambulatory referral to Gastroenterology  Prostate cancer screening -     PSA(Must document that pt has been informed of limitations of PSA testing.)  Essential hypertension Today BP mildly elevated. Goal BP < 140/90 at least. No changes in current management. Continue checking BP regularly.  -     amLODipine (NORVASC) 10 MG tablet; Take 1 tablet (10 mg total) by mouth daily. -     carvedilol (COREG) 3.125 MG tablet; Take 1 tablet (3.125 mg total) by mouth 2 (two) times daily with a meal.  Acute on chronic diastolic CHF (congestive heart failure) ([place]) Recommend arranging appt with his cardiologist. No changes in current management.  -     amLODipine (NORVASC) 10 MG tablet; Take 1 tablet (10 mg total) by mouth daily. -     carvedilol (COREG) 3.125 MG tablet; Take 1 tablet (3.125 mg total) by mouth 2 (two) times daily with a meal.  Need for pneumococcal vaccination -     Pneumococcal polysaccharide vaccine 23-valent greater than or equal to 2yo subcutaneous/IM    Return in 6 months (on 04/24/2020).    Chelsie Burel G. Martinique, MD  [facility]. [place] office.

## 2019-10-26 NOTE — Patient Instructions (Addendum)
A few things to remember from today's visit:   Routine general medical examination at a health care facility  Colon cancer screening - Plan: Ambulatory referral to Gastroenterology  Prostate cancer screening - Plan: PSA(Must document that pt has been informed of limitations of PSA testing.)  At least 150 minutes of moderate exercise per week, daily brisk walking for 15-30 min is a good exercise option. Healthy diet low in saturated (animal) fats and sweets and consisting of fresh fruits and vegetables, lean meats such as fish and white chicken and whole grains.  - Vaccines:  Tdap vaccine every 10 years.  Shingles vaccine recommended at age 9, could be given after 54 years of age but not sure about insurance coverage.  Pneumonia vaccines:  Pneumovax today because smoker and another one at 64..  -Screening recommendations for low/normal risk males:  Screening for diabetes at age 58 and every 3 years. Earlier screening if cardiovascular risk factors.   Lipid screening N/A  Colon cancer screening at age 23 and until age 67.  Prostate cancer screening: some controversy, starts usually at 85: Rectal exam and PSA.  Aortic Abdominal Aneurism once between 77 and 43 years old if ever smoker.  Also recommended:  1. Dental visit- Brush and floss your teeth twice daily; visit your dentist twice a year. 2. Eye doctor- Get an eye exam at least every 2 years. 3. Helmet use- Always wear a helmet when riding a bicycle, motorcycle, rollerblading or skateboarding. 4. Safe sex- If you may be exposed to sexually transmitted infections, use a condom. 5. Seat belts- Seat belts can save your live; always wear one. 6. Smoke/Carbon Monoxide detectors- These detectors need to be installed on the appropriate level of your home. Replace batteries at least once a year. 7. Skin cancer- When out in the sun please cover up and use sunscreen 15 SPF or higher. 8. Violence- If anyone is threatening or hurting  you, please tell your healthcare provider.  9. Drink alcohol in moderation- Limit alcohol intake to one drink or less per day. Never drink and drive.   Please be sure medication list is accurate. If a new problem present, please set up appointment sooner than planned today.

## 2019-11-09 ENCOUNTER — Ambulatory Visit (AMBULATORY_SURGERY_CENTER): Payer: Self-pay | Admitting: *Deleted

## 2019-11-09 ENCOUNTER — Other Ambulatory Visit: Payer: Self-pay

## 2019-11-09 VITALS — Temp 97.5°F | Ht 74.0 in | Wt 220.0 lb

## 2019-11-09 DIAGNOSIS — Z1211 Encounter for screening for malignant neoplasm of colon: Secondary | ICD-10-CM

## 2019-11-09 DIAGNOSIS — Z01818 Encounter for other preprocedural examination: Secondary | ICD-10-CM

## 2019-11-09 MED ORDER — PLENVU 140 G PO SOLR: 1.0000 | Freq: Once | ORAL | 0 refills | Status: AC

## 2019-11-09 NOTE — Progress Notes (Signed)
Patient is here in-person for PV. Patient denies any allergies to eggs or soy. Patient denies any problems with anesthesia/sedation. Patient denies any oxygen use at home. Patient denies taking any diet/weight loss medications or blood thinners. Patient is not being treated for MRSA or C-diff. EMMI education assisgned to the patient for the procedure, this was explained and instructions given to patient. COVID-19 screening test is on 1/28, the pt is aware. Pt is aware that care partner will wait in the car during procedure; if they feel like they will be too hot or cold to wait in the car; they may wait in the 4 th floor lobby. Patient is aware to bring only one care partner. We want them to wear a mask (we do not have any that we can provide them), practice social distancing, and we will check their temperatures when they get here.  I did remind the patient that their care partner needs to stay in the parking lot the entire time and have a cell phone available, we will call them when the pt is ready for discharge. Patient will wear mask into building.    plenvu coupon given to the patient.

## 2019-11-10 ENCOUNTER — Encounter: Payer: Self-pay | Admitting: Gastroenterology

## 2019-11-14 ENCOUNTER — Telehealth: Payer: Self-pay

## 2019-11-14 NOTE — Telephone Encounter (Signed)
Received fax from [facility]  - Plenvu not available.  Requesting alternative prep. Procedure 11-21-19

## 2019-11-14 NOTE — Telephone Encounter (Signed)
Called pharmacy, Curtis Patton states Plenvu is coming in today. Cost for the patient is $60. Patient also has a coupon that will help with the amount.

## 2019-11-17 ENCOUNTER — Other Ambulatory Visit: Payer: Self-pay | Admitting: Gastroenterology

## 2019-11-17 ENCOUNTER — Ambulatory Visit (INDEPENDENT_AMBULATORY_CARE_PROVIDER_SITE_OTHER): Payer: Self-pay

## 2019-11-17 DIAGNOSIS — Z1159 Encounter for screening for other viral diseases: Secondary | ICD-10-CM

## 2019-11-18 LAB — SARS CORONAVIRUS 2 (TAT 6-24 HRS): SARS Coronavirus 2: NEGATIVE

## 2019-11-19 ENCOUNTER — Telehealth: Payer: Self-pay | Admitting: Gastroenterology

## 2019-11-19 NOTE — Telephone Encounter (Signed)
[facility]: Dr. Havery Moros  After hours call re: bowel prep questions  Instructions for colonoscopy told him to take the first dose "the day before Sunday." His procedure is Monday morning at 11:30. He called to clarify the timing of Plenvu. He will take the first dose of Plenvu Sunday at 6pm. He will take the second dose Monday at 6am. I reminded him to follow a clear liquid diet starting Sunday, as he was also confused about what he could and could not eat.  He will call back with any additional questions.

## 2019-11-20 NOTE — Telephone Encounter (Signed)
Thank you Joelene Millin, sorry you were bothered about this, appreciate the help.

## 2019-11-21 ENCOUNTER — Encounter: Payer: Self-pay | Admitting: Gastroenterology

## 2019-11-21 ENCOUNTER — Other Ambulatory Visit: Payer: Self-pay

## 2019-11-21 ENCOUNTER — Ambulatory Visit (AMBULATORY_SURGERY_CENTER): Payer: BC Managed Care – PPO | Admitting: Gastroenterology

## 2019-11-21 VITALS — BP 131/91 | HR 72 | Temp 97.3°F | Resp 17 | Ht 74.0 in | Wt 220.0 lb

## 2019-11-21 DIAGNOSIS — K5669 Other partial intestinal obstruction: Secondary | ICD-10-CM

## 2019-11-21 DIAGNOSIS — D120 Benign neoplasm of cecum: Secondary | ICD-10-CM

## 2019-11-21 DIAGNOSIS — D122 Benign neoplasm of ascending colon: Secondary | ICD-10-CM

## 2019-11-21 DIAGNOSIS — Z1211 Encounter for screening for malignant neoplasm of colon: Secondary | ICD-10-CM | POA: Diagnosis present

## 2019-11-21 DIAGNOSIS — K635 Polyp of colon: Secondary | ICD-10-CM | POA: Diagnosis not present

## 2019-11-21 MED ORDER — SODIUM CHLORIDE 0.9 % IV SOLN: 500.0000 mL | Freq: Once | INTRAVENOUS | Status: DC

## 2019-11-21 NOTE — Patient Instructions (Signed)
Handout on polyps ,diverticulosis, & hemorrhoids given to you today  Await pathology results on polyps removed and on biopsies done     YOU HAD AN ENDOSCOPIC PROCEDURE TODAY AT [facility]:   Refer to the procedure report that was given to you for any specific questions about what was found during the examination.  If the procedure report does not answer your questions, please call your gastroenterologist to clarify.  If you requested that your care partner not be given the details of your procedure findings, then the procedure report has been included in a sealed envelope for you to review at your convenience later.  YOU SHOULD EXPECT: Some feelings of bloating in the abdomen. Passage of more gas than usual.  Walking can help get rid of the air that was put into your GI tract during the procedure and reduce the bloating. If you had a lower endoscopy (such as a colonoscopy or flexible sigmoidoscopy) you may notice spotting of blood in your stool or on the toilet paper. If you underwent a bowel prep for your procedure, you may not have a normal bowel movement for a few days.  Please Note:  You might notice some irritation and congestion in your nose or some drainage.  This is from the oxygen used during your procedure.  There is no need for concern and it should clear up in a day or so.  SYMPTOMS TO REPORT IMMEDIATELY:   Following lower endoscopy (colonoscopy or flexible sigmoidoscopy):  Excessive amounts of blood in the stool  Significant tenderness or worsening of abdominal pains  Swelling of the abdomen that is new, acute  Fever of 100F or higher   Following upper endoscopy (EGD)  Vomiting of blood or coffee ground material  New chest pain or pain under the shoulder blades  Painful or persistently difficult swallowing  New shortness of breath  Fever of 100F or higher  Black, tarry-looking stools  For urgent or emergent issues, a gastroenterologist can be  reached at any hour by calling [phone].   DIET:  We do recommend a small meal at first, but then you may proceed to your regular diet.  Drink plenty of fluids but you should avoid alcoholic beverages for 24 hours.  ACTIVITY:  You should plan to take it easy for the rest of today and you should NOT DRIVE or use heavy machinery until tomorrow (because of the sedation medicines used during the test).    FOLLOW UP: Our staff will call the number listed on your records 48-72 hours following your procedure to check on you and address any questions or concerns that you may have regarding the information given to you following your procedure. If we do not reach you, we will leave a message.  We will attempt to reach you two times.  During this call, we will ask if you have developed any symptoms of COVID 19. If you develop any symptoms (ie: fever, flu-like symptoms, shortness of breath, cough etc.) before then, please call [phone].  If you test positive for Covid 19 in the 2 weeks post procedure, please call and report this information to [place].    If any biopsies were taken you will be contacted by phone or by letter within the next 1-3 weeks.  Please call us at [phone] if you have not heard about the biopsies in 3 weeks.    SIGNATURES/CONFIDENTIALITY: You and/or your care partner have signed paperwork which will be entered  into your electronic medical record.  These signatures attest to the fact that that the information above on your After Visit Summary has been reviewed and is understood.  Full responsibility of the confidentiality of this discharge information lies with you and/or your care-partner.

## 2019-11-21 NOTE — Progress Notes (Signed)
A and O x3. Report to RN. Tolerated MAC anesthesia well.

## 2019-11-21 NOTE — Progress Notes (Signed)
Called to room to assist during endoscopic procedure.  Patient ID and intended procedure confirmed with present staff. Received instructions for my participation in the procedure from the performing physician.  

## 2019-11-21 NOTE — Progress Notes (Signed)
Pt's states no medical or surgical changes since previsit or office visit.  Temp-jb  V/s-cw 

## 2019-11-21 NOTE — Op Note (Signed)
Curtis Patton: Curtis Patton Procedure Date: 11/21/2019 12:20 PM MRN: [id number] Endoscopist: Remo Lipps P. Havery Moros , MD Age: 54 Referring MD:  Date of Birth: [birth year] Gender: Male Account #: [id number] Procedure:                Colonoscopy Indications:              Screening for colorectal malignant neoplasm, This                            is the patient's first colonoscopy Medicines:                Monitored Anesthesia Care Procedure:                Pre-Anesthesia Assessment:                           - Prior to the procedure, a History and Physical                            was performed, and patient medications and                            allergies were reviewed. The patient's tolerance of                            previous anesthesia was also reviewed. The risks                            and benefits of the procedure and the sedation                            options and risks were discussed with the patient.                            All questions were answered, and informed consent                            was obtained. Prior Anticoagulants: The patient has                            taken no previous anticoagulant or antiplatelet                            agents. ASA Grade Assessment: II - A patient with                            mild systemic disease. After reviewing the risks                            and benefits, the patient was deemed in                            satisfactory condition to undergo the procedure.  After obtaining informed consent, the colonoscope                            was passed under direct vision. Throughout the                            procedure, the patient's blood pressure, pulse, and                            oxygen saturations were monitored continuously. The                            Colonoscope was introduced through the anus and                            advanced to the the  cecum, identified by                            appendiceal orifice and ileocecal valve. The                            colonoscopy was performed without difficulty. The                            patient tolerated the procedure well. The quality                            of the bowel preparation was good. The ileocecal                            valve, appendiceal orifice, and rectum were                            photographed. Scope In: 12:27:36 PM Scope Out: 12:48:16 PM Scope Withdrawal Time: 0 hours 18 minutes 46 seconds  Total Procedure Duration: 0 hours 20 minutes 40 seconds  Findings:                 The perianal and digital rectal examinations were                            normal.                           A 4 mm polyp was found in the ileocecal valve. The                            polyp was flat. The polyp was removed with a cold                            snare. Resection and retrieval were complete.                           A 3 mm polyp was found in the ascending colon. The  polyp was sessile. The polyp was removed with a                            cold snare. Resection and retrieval were complete.                           Multiple small-mouthed diverticula were found in                            the sigmoid colon.                           An localized area of severely congested mucosa was                            found in the sigmoid colon, causing mild stenosis.                            Suspect reactive, perhaps to diverticulosis.                            Biopsies were taken with a cold forceps for                            histology.                           Internal hemorrhoids were found during retroflexion.                           The exam was otherwise without abnormality. Complications:            No immediate complications. Estimated blood loss:                            Minimal. Estimated Blood Loss:     Estimated blood  loss was minimal. Impression:               - One 4 mm polyp at the ileocecal valve, removed                            with a cold snare. Resected and retrieved.                           - One 3 mm polyp in the ascending colon, removed                            with a cold snare. Resected and retrieved.                           - Diverticulosis in the sigmoid colon.                           - Localized congested mucosa in the sigmoid colon  causing mild luminal narrowing. Biopsied.                           - Internal hemorrhoids.                           - The examination was otherwise normal. Recommendation:           - Patient has a contact number available for                            emergencies. The signs and symptoms of potential                            delayed complications were discussed with the                            patient. Return to normal activities tomorrow.                            Written discharge instructions were provided to the                            patient.                           - Resume previous diet.                           - Continue present medications.                           - Await pathology results. Remo Lipps P. Chloe Bluett, MD 11/21/2019 12:54:07 PM This report has been signed electronically.

## 2019-11-23 ENCOUNTER — Telehealth: Payer: Self-pay | Admitting: *Deleted

## 2019-11-23 ENCOUNTER — Telehealth: Payer: Self-pay

## 2019-11-23 NOTE — Telephone Encounter (Signed)
  Follow up Call-  Call back number 11/21/2019  Post procedure Call Back phone  # [phone]  Permission to leave phone message Yes  Some recent data might be hidden     Patient questions:  Do you have a fever, pain , or abdominal swelling? No. Pain Score  0 *  Have you tolerated food without any problems? Yes.    Have you been able to return to your normal activities? Yes.    Do you have any questions about your discharge instructions: Diet   No. Medications  No. Follow up visit  No.  Do you have questions or concerns about your Care? No.  Actions: * If pain score is 4 or above: No action needed, pain <4.   1. Have you developed a fever since your procedure? no  2.   Have you had an respiratory symptoms (SOB or cough) since your procedure? no  3.   Have you tested positive for COVID 19 since your procedure no  4.   Have you had any family members/close contacts diagnosed with the COVID 19 since your procedure?  no   If yes to any of these questions please route to Joylene John, RN and Alphonsa Gin, Therapist, sports.

## 2019-11-23 NOTE — Telephone Encounter (Signed)
  Follow up Call-  Call back number 11/21/2019  Post procedure Call Back phone  # [phone]  Permission to leave phone message Yes  Some recent data might be hidden     LEFT MESSAGE

## 2019-12-08 ENCOUNTER — Ambulatory Visit: Payer: BC Managed Care – PPO | Admitting: Cardiovascular Disease

## 2019-12-16 ENCOUNTER — Ambulatory Visit: Payer: BC Managed Care – PPO | Admitting: Cardiovascular Disease

## 2019-12-16 ENCOUNTER — Other Ambulatory Visit: Payer: Self-pay

## 2019-12-16 ENCOUNTER — Encounter: Payer: Self-pay | Admitting: Cardiovascular Disease

## 2019-12-16 VITALS — BP 160/104 | HR 81 | Ht 74.0 in

## 2019-12-16 DIAGNOSIS — Z720 Tobacco use: Secondary | ICD-10-CM | POA: Diagnosis not present

## 2019-12-16 DIAGNOSIS — I5032 Chronic diastolic (congestive) heart failure: Secondary | ICD-10-CM

## 2019-12-16 DIAGNOSIS — M79604 Pain in right leg: Secondary | ICD-10-CM

## 2019-12-16 DIAGNOSIS — E782 Mixed hyperlipidemia: Secondary | ICD-10-CM | POA: Diagnosis not present

## 2019-12-16 DIAGNOSIS — I10 Essential (primary) hypertension: Secondary | ICD-10-CM

## 2019-12-16 DIAGNOSIS — I5033 Acute on chronic diastolic (congestive) heart failure: Secondary | ICD-10-CM

## 2019-12-16 MED ORDER — PROPRANOLOL HCL 10 MG PO TABS: 10.0000 mg | ORAL_TABLET | Freq: Two times a day (BID) | ORAL | 2 refills | Status: DC

## 2019-12-16 MED ORDER — ROSUVASTATIN CALCIUM 20 MG PO TABS: 20.0000 mg | ORAL_TABLET | Freq: Every day | ORAL | 3 refills | Status: DC

## 2019-12-16 MED ORDER — CARVEDILOL 25 MG PO TABS: 25.0000 mg | ORAL_TABLET | Freq: Two times a day (BID) | ORAL | 1 refills | Status: DC

## 2019-12-16 NOTE — Patient Instructions (Addendum)
Medication Instructions:  Increase Carvedilol 25 mg twice daily  Take Crestor 20 daily   *If you need a refill on your cardiac medications before your next appointment, please call your pharmacy*  Lab:  Fasting lipid panel one week before follow up appointment.   Testing/Procedures: Your physician has requested that you have an ankle brachial index (ABI). During this test an ultrasound and blood pressure cuff are used to evaluate the arteries that supply the arms and legs with blood. Allow thirty minutes for this exam. There are no restrictions or special instructions.  Follow-Up: At [facility], you and your health needs are our priority.  As part of our continuing mission to provide you with exceptional heart care, we have created designated Provider Care Teams.  These Care Teams include your primary Cardiologist (physician) and Advanced Practice Providers (APPs -  Physician Assistants and Nurse Practitioners) who all work together to provide you with the care you need, when you need it.  We recommend signing up for the patient portal called "MyChart".  Sign up information is provided on this After Visit Summary.  MyChart is used to connect with patients for Virtual Visits (Telemedicine).  Patients are able to view lab/test results, encounter notes, upcoming appointments, etc.  Non-urgent messages can be sent to your provider as well.   To learn more about what you can do with MyChart, go to [email or web address].    Your next appointment:   3 month(s)  The format for your next appointment:   In Person  Provider:   Eleonore Chiquito, MD

## 2019-12-16 NOTE — Progress Notes (Signed)
Cardiology Office Note:   Date:  12/16/2019  NAME:  Curtis Patton    MRN: [id number] DOB:  May 27, 1966   PCP:  Billie Ruddy, MD  Cardiologist:  Evalina Field, MD   Referring MD: Billie Ruddy, MD   Chief Complaint  Patient presents with  . Follow-up    3 months.    History of Present Illness:   Curtis Patton is a 54 y.o. male with a hx of hypertension, HFpEF, tobacco abuse who presents for follow-up of HFpEF.  He reports he is doing well since her last visit.  Blood pressure a bit elevated 160/104.  He denies any chest pain or shortness of breath.  He reports no lower extremity edema.  He continues to smoke 1 cigar every other day.  I advised him to quit.  He also endorses leg pain that occurs with walking.  He reports it feels like a cramping pain in the right lower extremity.  He states he feels like with minimal activity he does exercise much more than he does.  Symptoms resolve with rest.  He has poor pulses noted in the lower extremities.  Most recent lipid profile shows LDL 136.  He denies any chest pain, shortness of breath, palpitations.  Problem List 1. HFpEF  2. HTN 3. HLD 4. Tobacco Abuse   Past Medical History: Past Medical History:  Diagnosis Date  . (HFpEF) heart failure with preserved ejection fraction ([place])    a. Echo 08/10/2018: LVEF 50-55% w/ mild LVH, mild diffuse hypokinesis w/ no regional variation, and G1DD  . Acute CHF ([place])   . Acute renal insufficiency   . Acute respiratory failure with hypoxia ([place])   . Hypertension   . Migraine     Past Surgical History: Past Surgical History:  Procedure Laterality Date  . JOINT REPLACEMENT    . neck    . ROTATOR CUFF REPAIR    . SPINE SURGERY      Current Medications: Current Meds  Medication Sig  . amLODipine (NORVASC) 10 MG tablet Take 1 tablet (10 mg total) by mouth daily.  . carvedilol (COREG) 25 MG tablet Take 1 tablet (25 mg total) by mouth 2 (two) times daily with a meal.  . Saw  Palmetto, Serenoa repens, (SAW PALMETTO PO) Take 2 tablets by mouth daily.  . sildenafil (VIAGRA) 25 MG tablet Take 1 tablet (25 mg total) by mouth daily as needed for erectile dysfunction.  . [DISCONTINUED] carvedilol (COREG) 3.125 MG tablet Take 1 tablet (3.125 mg total) by mouth 2 (two) times daily with a meal.     Allergies:    Contrast media [iodinated diagnostic agents]   Social History: Social History   Socioeconomic History  . Marital status: Married    Spouse name: Not on file  . Number of children: Not on file  . Years of education: Not on file  . Highest education level: Not on file  Occupational History  . Not on file  Tobacco Use  . Smoking status: Current Some Day Smoker    Packs/day: 1.00    Types: Cigars  . Smokeless tobacco: Never Used  Substance and Sexual Activity  . Alcohol use: Yes    Alcohol/week: 6.0 standard drinks    Types: 4 Cans of beer, 2 Shots of liquor per week  . Drug use: No  . Sexual activity: Not Currently    Partners: Female  Other Topics Concern  . Not on file  Social History Narrative  .  Not on file   Social Determinants of Health   Financial Resource Strain:   . Difficulty of Paying Living Expenses: Not on file  Food Insecurity:   . Worried About Charity fundraiser in the Last Year: Not on file  . Ran Out of Food in the Last Year: Not on file  Transportation Needs:   . Lack of Transportation (Medical): Not on file  . Lack of Transportation (Non-Medical): Not on file  Physical Activity:   . Days of Exercise per Week: Not on file  . Minutes of Exercise per Session: Not on file  Stress:   . Feeling of Stress : Not on file  Social Connections:   . Frequency of Communication with Friends and Family: Not on file  . Frequency of Social Gatherings with Friends and Family: Not on file  . Attends Religious Services: Not on file  . Active Member of Clubs or Organizations: Not on file  . Attends Archivist Meetings: Not on  file  . Marital Status: Not on file     Family History: The patient's family history includes Hypertension in his maternal grandmother, mother, and another family member; Sudden death in his paternal grandmother. There is no history of Colon cancer, Colon polyps, Esophageal cancer, Rectal cancer, or Stomach cancer.  ROS:   All other ROS reviewed and negative. Pertinent positives noted in the HPI.     EKGs/Labs/Other Studies Reviewed:   The following studies were personally reviewed by me today:  TTE 08/10/2018  - Left ventricle: The cavity size was normal. There was mild  concentric hypertrophy. Systolic function was at the lower limits  of normal. The estimated ejection fraction was in the range of  50% to 55%. Mild diffuse hypokinesis with no identifiable  regional variations. Doppler parameters are consistent with  abnormal left ventricular relaxation (grade 1 diastolic  dysfunction).   Recent Labs: 06/22/2019: ALT 26; BUN 8; Creatinine, Ser 0.91; Potassium 4.4; Sodium 138; TSH 0.727   Recent Lipid Panel    Component Value Date/Time   CHOL 203 (H) 06/22/2019 1008   TRIG 124 06/22/2019 1008   HDL 45 06/22/2019 1008   CHOLHDL 4.5 06/22/2019 1008   LDLCALC 136 (H) 06/22/2019 1008    Physical Exam:   VS:  BP (!) 160/104 (BP Location: Left Arm, Patient Position: Sitting, Cuff Size: Normal)   Pulse 81   Ht 6\' 2"  (1.88 m)   BMI 28.25 kg/m    Wt Readings from Last 3 Encounters:  11/21/19 220 lb (99.8 kg)  11/09/19 220 lb (99.8 kg)  10/26/19 223 lb (101.2 kg)    General: Well nourished, well developed, in no acute distress Heart: Atraumatic, normal size  Eyes: PEERLA, EOMI  Neck: Supple, no JVD Endocrine: No thryomegaly Cardiac: Normal S1, S2; RRR; no murmurs, rubs, or gallops Lungs: Clear to auscultation bilaterally, no wheezing, rhonchi or rales  Abd: Soft, nontender, no hepatomegaly  Ext: Diminished pulses in lower extremities  bilaterally Musculoskeletal: No deformities, BUE and BLE strength normal and equal Skin: Warm and dry, no rashes   Neuro: Alert and oriented to person, place, time, and situation, CNII-XII grossly intact, no focal deficits  Psych: Normal mood and affect   ASSESSMENT:   Curtis Patton is a 54 y.o. male who presents for the following: 1. Chronic diastolic heart failure ([place])   2. Essential hypertension   3. Mixed hyperlipidemia   4. Tobacco abuse   5. Pain of right lower extremity  6. Acute on chronic diastolic CHF (congestive heart failure) ([place])     PLAN:   1. Chronic diastolic heart failure (HCC) -Euvolemic on examination.  Blood pressure not at goal. -We will increase his Coreg to 25 mg twice daily.  He will continue losartan 50 mg daily as well as amlodipine 10 mg daily. -Euvolemic and no need for Lasix.  2. Essential hypertension -Increase carvedilol to 25 mg twice daily.  Other medicines as above.  3. Mixed hyperlipidemia -We will go ahead and treat him with Crestor 20 mg daily.  We will aim for an LDL less than 70.  I do wonder if he has PAD and we will check ABIs as below. -We will see him back in 3 months and recheck a lipid profile 1 week before that appointment to discuss his results.  4. Tobacco abuse -Smoking cessation counseling provided  5. Pain of right lower extremity -He reports pain in his right lower extremity with walking.  He has diminished pulses.  We will go ahead and obtain lower extremity Dopplers and ABIs.  Disposition: Return in about 3 months (around 03/14/2020).  Medication Adjustments/Labs and Tests Ordered: Current medicines are reviewed at length with the patient today.  Concerns regarding medicines are outlined above.  Orders Placed This Encounter  Procedures  . Lipid panel  . VAS [place] ABI WITH/WO TBI   Meds ordered this encounter  Medications  . DISCONTD: propranolol (INDERAL) 10 MG tablet    Sig: Take 1 tablet (10 mg total) by mouth  2 (two) times daily.    Dispense:  60 tablet    Refill:  2  . carvedilol (COREG) 25 MG tablet    Sig: Take 1 tablet (25 mg total) by mouth 2 (two) times daily with a meal.    Dispense:  180 tablet    Refill:  1  . rosuvastatin (CRESTOR) 20 MG tablet    Sig: Take 1 tablet (20 mg total) by mouth daily.    Dispense:  90 tablet    Refill:  3    Patient Instructions  Medication Instructions:  Increase Carvedilol 25 mg twice daily  Take Crestor 20 daily   *If you need a refill on your cardiac medications before your next appointment, please call your pharmacy*  Lab:  Fasting lipid panel one week before follow up appointment.   Testing/Procedures: Your physician has requested that you have an ankle brachial index (ABI). During this test an ultrasound and blood pressure cuff are used to evaluate the arteries that supply the arms and legs with blood. Allow thirty minutes for this exam. There are no restrictions or special instructions.  Follow-Up: At [facility], you and your health needs are our priority.  As part of our continuing mission to provide you with exceptional heart care, we have created designated Provider Care Teams.  These Care Teams include your primary Cardiologist (physician) and Advanced Practice Providers (APPs -  Physician Assistants and Nurse Practitioners) who all work together to provide you with the care you need, when you need it.  We recommend signing up for the patient portal called "MyChart".  Sign up information is provided on this After Visit Summary.  MyChart is used to connect with patients for Virtual Visits (Telemedicine).  Patients are able to view lab/test results, encounter notes, upcoming appointments, etc.  Non-urgent messages can be sent to your provider as well.   To learn more about what you can do with MyChart, go to [email or web address].  Your next appointment:   3 month(s)  The format for your next appointment:   In Person  Provider:    Eleonore Chiquito, MD          Signed, Addison Naegeli. Audie Box, [place]  [address], [place], [place] [address] [phone]  12/16/2019 4:48 PM

## 2019-12-19 ENCOUNTER — Other Ambulatory Visit: Payer: Self-pay | Admitting: Cardiovascular Disease

## 2019-12-19 DIAGNOSIS — I739 Peripheral vascular disease, unspecified: Secondary | ICD-10-CM

## 2019-12-20 ENCOUNTER — Other Ambulatory Visit (HOSPITAL_COMMUNITY): Payer: Self-pay | Admitting: Cardiovascular Disease

## 2019-12-20 DIAGNOSIS — M5410 Radiculopathy, site unspecified: Secondary | ICD-10-CM

## 2019-12-20 DIAGNOSIS — M79604 Pain in right leg: Secondary | ICD-10-CM

## 2019-12-21 ENCOUNTER — Ambulatory Visit (HOSPITAL_COMMUNITY): Payer: BC Managed Care – PPO

## 2019-12-24 ENCOUNTER — Ambulatory Visit: Payer: BC Managed Care – PPO | Attending: Internal Medicine

## 2019-12-24 DIAGNOSIS — Z23 Encounter for immunization: Secondary | ICD-10-CM | POA: Insufficient documentation

## 2019-12-24 NOTE — Progress Notes (Signed)
   Covid-19 Vaccination Clinic  Name:  Mehki Cefaratti    MRN: [id number] DOB: 1966/06/22  12/24/2019  Mr. Gehm was observed post Covid-19 immunization for 15 minutes without incident. He was provided with Vaccine Information Sheet and instruction to access the V-Safe system.   Mr. Schloss was instructed to call 911 with any severe reactions post vaccine: Marland Kitchen Difficulty breathing  . Swelling of face and throat  . A fast heartbeat  . A bad rash all over body  . Dizziness and weakness   Immunizations Administered    Name Date Dose VIS Date Route   Pfizer COVID-19 Vaccine 12/24/2019  9:46 AM 0.3 mL 09/30/2019 Intramuscular   Manufacturer: [place]   Lot: [id number]   [place]: [id number]

## 2019-12-28 ENCOUNTER — Other Ambulatory Visit: Payer: Self-pay

## 2019-12-28 ENCOUNTER — Ambulatory Visit (HOSPITAL_COMMUNITY)
Admission: RE | Admit: 2019-12-28 | Discharge: 2019-12-28 | Disposition: A | Discharge status: Home / Self Care | Payer: BC Managed Care – PPO | Source: Ambulatory Visit | Attending: Cardiovascular Disease | Admitting: Cardiovascular Disease

## 2019-12-28 DIAGNOSIS — I739 Peripheral vascular disease, unspecified: Secondary | ICD-10-CM | POA: Diagnosis not present

## 2019-12-28 DIAGNOSIS — M79604 Pain in right leg: Secondary | ICD-10-CM | POA: Diagnosis not present

## 2020-01-04 ENCOUNTER — Ambulatory Visit (INDEPENDENT_AMBULATORY_CARE_PROVIDER_SITE_OTHER): Payer: BC Managed Care – PPO | Admitting: Cardiovascular Disease

## 2020-01-04 ENCOUNTER — Encounter: Payer: Self-pay | Admitting: Cardiovascular Disease

## 2020-01-04 ENCOUNTER — Other Ambulatory Visit: Payer: Self-pay

## 2020-01-04 VITALS — BP 162/100 | HR 77 | Ht 74.0 in | Wt 224.0 lb

## 2020-01-04 DIAGNOSIS — I739 Peripheral vascular disease, unspecified: Secondary | ICD-10-CM | POA: Insufficient documentation

## 2020-01-04 MED ORDER — SODIUM CHLORIDE 0.9% FLUSH: 3.0000 mL | Freq: Two times a day (BID) | INTRAVENOUS | Status: DC

## 2020-01-04 MED ORDER — PREDNISONE 50 MG PO TABS: ORAL_TABLET | ORAL | 0 refills | Status: DC

## 2020-01-04 NOTE — H&P (View-Only) (Signed)
01/04/2020 Curtis Patton   02/04/1966  [id number]  Primary Physician Curtis Ruddy, MD Primary Cardiologist: Curtis Harp MD Curtis Patton, [name]  HPI:  Curtis Patton is a 54 y.o. mild to moderately overweight separated African-American male father of 12 children, grandfather of 3 grandchildren who works for [employer].  Is referred to me by Dr. Audie Patton , his cardiologist, for peripheral vascular evaluation and treatment because of right calf lifestyle limiting claudication.  She does have a history of treated hypertension hyperlipidemia.  He denies chest pain or shortness of breath.  Is never had a heart attack or stroke.  Does have a history of tobacco abuse as well as heart failure with preserved EF.  He has noticed right calf claudication over the last several months which is lifestyle limiting.  Dopplers performed 12/28/2019 revealed a right ankle-brachial index of 0.74 and a left of 1.02 with a short segment occlusion mid right SFA.   Current Meds  Medication Sig  . amLODipine (NORVASC) 10 MG tablet Take 1 tablet (10 mg total) by mouth daily.  . carvedilol (COREG) 25 MG tablet Take 1 tablet (25 mg total) by mouth 2 (two) times daily with a meal.  . propranolol (INDERAL) 10 MG tablet Take 10 mg by mouth 2 (two) times daily.  . rosuvastatin (CRESTOR) 20 MG tablet Take 1 tablet (20 mg total) by mouth daily.  . Saw Palmetto, Serenoa repens, (SAW PALMETTO PO) Take 2 tablets by mouth daily.  . sildenafil (VIAGRA) 25 MG tablet Take 1 tablet (25 mg total) by mouth daily as needed for erectile dysfunction.     Allergies  Allergen Reactions  . Contrast Media [Iodinated Diagnostic Agents]     Social History   Socioeconomic History  . Marital status: Married    Spouse name: Not on file  . Number of children: Not on file  . Years of education: Not on file  . Highest education level: Not on file  Occupational History  . Not on file  Tobacco Use  .  Smoking status: Current Some Day Smoker    Packs/day: 1.00    Types: Cigars  . Smokeless tobacco: Never Used  Substance and Sexual Activity  . Alcohol use: Yes    Alcohol/week: 6.0 standard drinks    Types: 4 Cans of beer, 2 Shots of liquor per week  . Drug use: No  . Sexual activity: Not Currently    Partners: Female  Other Topics Concern  . Not on file  Social History Narrative  . Not on file   Social Determinants of Health   Financial Resource Strain:   . Difficulty of Paying Living Expenses:   Food Insecurity:   . Worried About Charity fundraiser in the Last Year:   . Arboriculturist in the Last Year:   Transportation Needs:   . Film/video editor (Medical):   Marland Kitchen Lack of Transportation (Non-Medical):   Physical Activity:   . Days of Exercise per Week:   . Minutes of Exercise per Session:   Stress:   . Feeling of Stress :   Social Connections:   . Frequency of Communication with Friends and Family:   . Frequency of Social Gatherings with Friends and Family:   . Attends Religious Services:   . Active Member of Clubs or Organizations:   . Attends Archivist Meetings:   Marland Kitchen Marital Status:   Intimate Partner Violence:   .  Fear of Current or Ex-Partner:   . Emotionally Abused:   Marland Kitchen Physically Abused:   . Sexually Abused:      Review of Systems: General: negative for chills, fever, night sweats or weight changes.  Cardiovascular: negative for chest pain, dyspnea on exertion, edema, orthopnea, palpitations, paroxysmal nocturnal dyspnea or shortness of breath Dermatological: negative for rash Respiratory: negative for cough or wheezing Urologic: negative for hematuria Abdominal: negative for nausea, vomiting, diarrhea, bright red blood per rectum, melena, or hematemesis Neurologic: negative for visual changes, syncope, or dizziness All other systems reviewed and are otherwise negative except as noted above.    Blood pressure (!) 162/100, pulse 77,  height 6\' 2"  (1.88 m), weight 224 lb (101.6 kg), SpO2 98 %.  General appearance: alert and no distress Neck: no adenopathy, no carotid bruit, no JVD, supple, symmetrical, trachea midline and thyroid not enlarged, symmetric, no tenderness/mass/nodules Lungs: clear to auscultation bilaterally Heart: regular rate and rhythm, S1, S2 normal, no murmur, click, rub or gallop Extremities: extremities normal, atraumatic, no cyanosis or edema Pulses: 2+ and symmetric Skin: Skin color, texture, turgor normal. No rashes or lesions Neurologic: Alert and oriented X 3, normal strength and tone. Normal symmetric reflexes. Normal coordination and gait  EKG not performed today  ASSESSMENT AND PLAN:   Peripheral arterial disease [facility]) Mr. Curtis Patton was referred to me by Dr. Davina Patton for symptomatic right calf claudication.  He does have a history of treated hypertension hyperlipidemia as well as tobacco abuse.  He has had right calf claudication for several months.  This is limiting him on his job.  He works for [employer].  Recent lower extremity arterial Doppler studies performed 12/28/2019 revealed a right ankle-brachial index of 0.74 and a left of 1.02.  It appears he has a short segment occlusion mid right SFA.  He wishes to proceed with endovascular therapy for lifestyle limiting claudication.      Curtis Harp MD FACP,FACC,FAHA, [facility] 01/04/2020 12:07 PM

## 2020-01-04 NOTE — Progress Notes (Signed)
01/04/2020 Curtis Patton   Feb 05, 1966  [id number]  Primary Physician Curtis Ruddy, MD Primary Cardiologist: Curtis Harp MD Curtis Patton, [name]  HPI:  Curtis Patton is a 54 y.o. mild to moderately overweight separated African-American male father of 55 children, grandfather of 3 grandchildren who works for [employer].  Is referred to me by Dr. Audie Patton , his cardiologist, for peripheral vascular evaluation and treatment because of right calf lifestyle limiting claudication.  She does have a history of treated hypertension hyperlipidemia.  He denies chest pain or shortness of breath.  Is never had a heart attack or stroke.  Does have a history of tobacco abuse as well as heart failure with preserved EF.  He has noticed right calf claudication over the last several months which is lifestyle limiting.  Dopplers performed 12/28/2019 revealed a right ankle-brachial index of 0.74 and a left of 1.02 with a short segment occlusion mid right SFA.   Current Meds  Medication Sig  . amLODipine (NORVASC) 10 MG tablet Take 1 tablet (10 mg total) by mouth daily.  . carvedilol (COREG) 25 MG tablet Take 1 tablet (25 mg total) by mouth 2 (two) times daily with a meal.  . propranolol (INDERAL) 10 MG tablet Take 10 mg by mouth 2 (two) times daily.  . rosuvastatin (CRESTOR) 20 MG tablet Take 1 tablet (20 mg total) by mouth daily.  . Saw Palmetto, Serenoa repens, (SAW PALMETTO PO) Take 2 tablets by mouth daily.  . sildenafil (VIAGRA) 25 MG tablet Take 1 tablet (25 mg total) by mouth daily as needed for erectile dysfunction.     Allergies  Allergen Reactions  . Contrast Media [Iodinated Diagnostic Agents]     Social History   Socioeconomic History  . Marital status: Married    Spouse name: Not on file  . Number of children: Not on file  . Years of education: Not on file  . Highest education level: Not on file  Occupational History  . Not on file  Tobacco Use  .  Smoking status: Current Some Day Smoker    Packs/day: 1.00    Types: Cigars  . Smokeless tobacco: Never Used  Substance and Sexual Activity  . Alcohol use: Yes    Alcohol/week: 6.0 standard drinks    Types: 4 Cans of beer, 2 Shots of liquor per week  . Drug use: No  . Sexual activity: Not Currently    Partners: Female  Other Topics Concern  . Not on file  Social History Narrative  . Not on file   Social Determinants of Health   Financial Resource Strain:   . Difficulty of Paying Living Expenses:   Food Insecurity:   . Worried About Charity fundraiser in the Last Year:   . Arboriculturist in the Last Year:   Transportation Needs:   . Film/video editor (Medical):   Marland Kitchen Lack of Transportation (Non-Medical):   Physical Activity:   . Days of Exercise per Week:   . Minutes of Exercise per Session:   Stress:   . Feeling of Stress :   Social Connections:   . Frequency of Communication with Friends and Family:   . Frequency of Social Gatherings with Friends and Family:   . Attends Religious Services:   . Active Member of Clubs or Organizations:   . Attends Archivist Meetings:   Marland Kitchen Marital Status:   Intimate Partner Violence:   .  Fear of Current or Ex-Partner:   . Emotionally Abused:   Marland Kitchen Physically Abused:   . Sexually Abused:      Review of Systems: General: negative for chills, fever, night sweats or weight changes.  Cardiovascular: negative for chest pain, dyspnea on exertion, edema, orthopnea, palpitations, paroxysmal nocturnal dyspnea or shortness of breath Dermatological: negative for rash Respiratory: negative for cough or wheezing Urologic: negative for hematuria Abdominal: negative for nausea, vomiting, diarrhea, bright red blood per rectum, melena, or hematemesis Neurologic: negative for visual changes, syncope, or dizziness All other systems reviewed and are otherwise negative except as noted above.    Blood pressure (!) 162/100, pulse 77,  height 6\' 2"  (1.88 m), weight 224 lb (101.6 kg), SpO2 98 %.  General appearance: alert and no distress Neck: no adenopathy, no carotid bruit, no JVD, supple, symmetrical, trachea midline and thyroid not enlarged, symmetric, no tenderness/mass/nodules Lungs: clear to auscultation bilaterally Heart: regular rate and rhythm, S1, S2 normal, no murmur, click, rub or gallop Extremities: extremities normal, atraumatic, no cyanosis or edema Pulses: 2+ and symmetric Skin: Skin color, texture, turgor normal. No rashes or lesions Neurologic: Alert and oriented X 3, normal strength and tone. Normal symmetric reflexes. Normal coordination and gait  EKG not performed today  ASSESSMENT AND PLAN:   Peripheral arterial disease [facility]) [name] was referred to me by Dr. Davina Patton for symptomatic right calf claudication.  He does have a history of treated hypertension hyperlipidemia as well as tobacco abuse.  He has had right calf claudication for several months.  This is limiting him on his job.  He works for [employer].  Recent lower extremity arterial Doppler studies performed 12/28/2019 revealed a right ankle-brachial index of 0.74 and a left of 1.02.  It appears he has a short segment occlusion mid right SFA.  He wishes to proceed with endovascular therapy for lifestyle limiting claudication.      Curtis Harp MD FACP,FACC,FAHA, [facility] 01/04/2020 12:07 PM

## 2020-01-04 NOTE — Patient Instructions (Addendum)
    [place] [address] Dept: [phone] Loc: [place]  01/04/2020  You are scheduled for a Peripheral Angiogram on Thursday, March 25 with Dr. Quay Burow.  1. Please arrive at the [facility] (Main Entrance A) at [facility]: [address] [place],  [address] at 5:30 AM (This time is two hours before your procedure to ensure your preparation). Free valet parking service is available.   Special note: Every effort is made to have your procedure done on time. Please understand that emergencies sometimes delay scheduled procedures.  2. Diet: Do not eat solid foods after midnight.  The patient may have clear liquids until 5am upon the day of the procedure.  3. Labs: you will have lab work today  Eldred Meredosia Monday 01-09-20 @ 11:50 AM FOR COVID TESTING  4. Medication instructions in preparation for your procedure:   Contrast Allergy: Yes, Please take Prednisone 50mg  by mouth at: Thirteen hours prior to cath   Seven hours prior to cath   And prior to leaving home please take last dose of Prednisone 50mg  and Benadryl 50mg  by mouth.  On the morning of your procedure, take your Aspirin and any morning medicines NOT listed above.  You may use sips of water.  5. Plan for one night stay--bring personal belongings. 6. Bring a current list of your medications and current insurance cards. 7. You MUST have a responsible person to drive you home. 8. Someone MUST be with you the first 24 hours after you arrive home or your discharge will be delayed. 9. Please wear clothes that are easy to get on and off and wear slip-on shoes.  Thank you for allowing [place] to care for you!   -- [facility]   Your physician has requested that you have a lower extremity arterial duplex. During this test,  ultrasound are used to evaluate arterial blood flow in the legs. Allow one hour for this exam. There are no restrictions or special instructions. [place] physician recommends that you schedule a follow-up appointment in: 2 [place]

## 2020-01-04 NOTE — Assessment & Plan Note (Signed)
Curtis Patton was referred to me by Dr. Davina Poke for symptomatic right calf claudication.  He does have a history of treated hypertension hyperlipidemia as well as tobacco abuse.  He has had right calf claudication for several months.  This is limiting him on his job.  He works for [employer].  Recent lower extremity arterial Doppler studies performed 12/28/2019 revealed a right ankle-brachial index of 0.74 and a left of 1.02.  It appears he has a short segment occlusion mid right SFA.  He wishes to proceed with endovascular therapy for lifestyle limiting claudication.

## 2020-01-05 LAB — BASIC METABOLIC PANEL WITH GFR
BUN/Creatinine Ratio: 12 (ref 9–20)
BUN: 11 mg/dL (ref 6–24)
CO2: 23 mmol/L (ref 20–29)
Calcium: 9.6 mg/dL (ref 8.7–10.2)
Chloride: 104 mmol/L (ref 96–106)
Creatinine, Ser: 0.94 mg/dL (ref 0.76–1.27)
GFR calc Af Amer: 106 mL/min/1.73
GFR calc non Af Amer: 92 mL/min/1.73
Glucose: 77 mg/dL (ref 65–99)
Potassium: 4.9 mmol/L (ref 3.5–5.2)
Sodium: 139 mmol/L (ref 134–144)

## 2020-01-05 LAB — CBC WITH DIFFERENTIAL/PLATELET
Basophils Absolute: 0.1 10*3/uL (ref 0.0–0.2)
Basos: 1 %
EOS (ABSOLUTE): 0.4 10*3/uL (ref 0.0–0.4)
Eos: 3 %
Hematocrit: 48 % (ref 37.5–51.0)
Hemoglobin: 16 g/dL (ref 13.0–17.7)
Immature Grans (Abs): 0.1 10*3/uL (ref 0.0–0.1)
Immature Granulocytes: 1 %
Lymphocytes Absolute: 3.2 10*3/uL — ABNORMAL HIGH (ref 0.7–3.1)
Lymphs: 24 %
MCH: 27.9 pg (ref 26.6–33.0)
MCHC: 33.3 g/dL (ref 31.5–35.7)
MCV: 84 fL (ref 79–97)
Monocytes Absolute: 1.7 10*3/uL — ABNORMAL HIGH (ref 0.1–0.9)
Monocytes: 13 %
Neutrophils Absolute: 7.9 10*3/uL — ABNORMAL HIGH (ref 1.4–7.0)
Neutrophils: 58 %
Platelets: 282 10*3/uL (ref 150–450)
RBC: 5.73 x10E6/uL (ref 4.14–5.80)
RDW: 14.3 % (ref 11.6–15.4)
WBC: 13.3 10*3/uL — ABNORMAL HIGH (ref 3.4–10.8)

## 2020-01-09 ENCOUNTER — Other Ambulatory Visit (HOSPITAL_COMMUNITY): Payer: BC Managed Care – PPO

## 2020-01-09 ENCOUNTER — Other Ambulatory Visit (HOSPITAL_COMMUNITY)
Admission: RE | Admit: 2020-01-09 | Discharge: 2020-01-09 | Disposition: A | Discharge status: Home / Self Care | Payer: BC Managed Care – PPO | Source: Ambulatory Visit | Attending: Cardiovascular Disease | Admitting: Cardiovascular Disease

## 2020-01-09 DIAGNOSIS — Z01812 Encounter for preprocedural laboratory examination: Secondary | ICD-10-CM | POA: Insufficient documentation

## 2020-01-09 DIAGNOSIS — Z20822 Contact with and (suspected) exposure to covid-19: Secondary | ICD-10-CM | POA: Insufficient documentation

## 2020-01-10 ENCOUNTER — Telehealth: Payer: Self-pay | Admitting: *Deleted

## 2020-01-10 LAB — SARS CORONAVIRUS 2 (TAT 6-24 HRS): SARS Coronavirus 2: NEGATIVE

## 2020-01-10 NOTE — Telephone Encounter (Signed)
Pt contacted pre-abdominal aortogram scheduled at [facility] for: Thursday January 12, 2020 7:30 AM Verified arrival time and place: [facility]) at: 5:30 AM   No solid food after midnight prior to cath, clear liquids until 5 AM day of procedure.  Contrast allergy: yes 13 hour Prednisone and Benadryl Prep: Prednisone 50 mg 01/11/20 6:30 PM Prednisone 50 mg 01/12/20 12:30 AM  Prednisone 50 mg and Benadryl 50 mg 01/12/20 AM just prior to going to hospital for procedure Do not drive to hospital.  Hold: Viagra-until post procedure  AM meds can be  taken pre-cath with sip of water including: ASA 81 mg Prednisone 50 mg Benadryl 50 mg  Confirmed patient has responsible adult to drive home post procedure and observe 24 hours after arriving home:   Currently, due to Covid-19 pandemic, only one person will be allowed with patient. Must be the same person for patient's entire stay and will be required to wear a mask. They will be asked to wait in the waiting room for the duration of the patient's stay.  Patients are required to wear a mask when they enter the hospital.  [facility] to review procedure instructions with patient.

## 2020-01-11 NOTE — Telephone Encounter (Addendum)
    COVID-19 Pre-Screening Questions:  . In the past 7 to 10 days have you had a cough,  shortness of breath, headache, congestion, fever (100 or greater) body aches, chills, sore throat, or sudden loss of taste or sense of smell? no . Have you been around anyone with known Covid 19 in the past 7-10 days? no . Have you been around anyone who is awaiting Covid 19 test results in the past 7 to 10 days? no . Have you been around anyone who has been exposed to Covid 19, or has mentioned symptoms of Covid 19 within the past 7 to 10 days? no    I reviewed procedure/mask/visitor instructions, COVID-19 screening questions with patient, he verbalized understanding, thanked me for call.   Contrast allergy-Pt had already taken one Prednisone 50 mg this morning, he states he has two Prednisone 50 mg tablets remaining. I reviewed 13 hour Prednisone and Benadryl Prep times  with patient:             Prednisone 50 mg 01/11/20 6:30 PM             Prednisone 50 mg 01/12/20 12:30 AM              Prednisone 50 mg and Benadryl 50 mg 01/12/20 AM just prior to going to hospital for procedure.                        Pt aware I sent prescription to pharmacy for one Prednisone 50 mg tablet so he would have three Prednisone 50 mg tablets to start 6:30 PM tonight, pt verbalized understanding.

## 2020-01-12 ENCOUNTER — Encounter (HOSPITAL_COMMUNITY)
Admission: RE | Disposition: A | Discharge status: Home / Self Care | Payer: Self-pay | Source: Home / Self Care | Attending: Cardiovascular Disease

## 2020-01-12 ENCOUNTER — Other Ambulatory Visit: Payer: Self-pay

## 2020-01-12 ENCOUNTER — Ambulatory Visit (HOSPITAL_COMMUNITY)
Admission: RE | Admit: 2020-01-12 | Discharge: 2020-01-12 | Disposition: A | Discharge status: Home / Self Care | Payer: BC Managed Care – PPO | Attending: Cardiovascular Disease | Admitting: Cardiovascular Disease

## 2020-01-12 DIAGNOSIS — I10 Essential (primary) hypertension: Secondary | ICD-10-CM | POA: Diagnosis not present

## 2020-01-12 DIAGNOSIS — E785 Hyperlipidemia, unspecified: Secondary | ICD-10-CM | POA: Insufficient documentation

## 2020-01-12 DIAGNOSIS — F17290 Nicotine dependence, other tobacco product, uncomplicated: Secondary | ICD-10-CM | POA: Diagnosis not present

## 2020-01-12 DIAGNOSIS — Z79899 Other long term (current) drug therapy: Secondary | ICD-10-CM | POA: Diagnosis not present

## 2020-01-12 DIAGNOSIS — I739 Peripheral vascular disease, unspecified: Secondary | ICD-10-CM | POA: Diagnosis present

## 2020-01-12 DIAGNOSIS — I70211 Atherosclerosis of native arteries of extremities with intermittent claudication, right leg: Secondary | ICD-10-CM | POA: Insufficient documentation

## 2020-01-12 HISTORY — PX: ABDOMINAL AORTOGRAM W/LOWER EXTREMITY: CATH118223

## 2020-01-12 HISTORY — PX: PERIPHERAL VASCULAR ATHERECTOMY: CATH118256

## 2020-01-12 LAB — POCT ACTIVATED CLOTTING TIME
Activated Clotting Time: 285 seconds
Activated Clotting Time: 246 seconds
Activated Clotting Time: 257 seconds

## 2020-01-12 SURGERY — ABDOMINAL AORTOGRAM W/LOWER EXTREMITY
Anesthesia: LOCAL

## 2020-01-12 MED ORDER — HYDRALAZINE HCL 20 MG/ML IJ SOLN: 5.0000 mg | INTRAMUSCULAR | Status: DC | PRN

## 2020-01-12 MED ORDER — FENTANYL CITRATE (PF) 100 MCG/2ML IJ SOLN
INTRAMUSCULAR | Status: DC | PRN
  Administered 2020-01-12 (×3): 25 ug via INTRAVENOUS

## 2020-01-12 MED ORDER — IODIXANOL 320 MG/ML IV SOLN
INTRAVENOUS | Status: DC | PRN
  Administered 2020-01-12: 230 mL

## 2020-01-12 MED ORDER — MIDAZOLAM HCL 2 MG/2ML IJ SOLN
INTRAMUSCULAR | Status: AC
  Filled 2020-01-12: qty 2

## 2020-01-12 MED ORDER — CLOPIDOGREL BISULFATE 300 MG PO TABS
ORAL_TABLET | ORAL | Status: AC
  Filled 2020-01-12: qty 1

## 2020-01-12 MED ORDER — ACETAMINOPHEN 325 MG PO TABS: 650.0000 mg | ORAL_TABLET | ORAL | Status: DC | PRN

## 2020-01-12 MED ORDER — ASPIRIN 81 MG PO TBEC: 81.0000 mg | DELAYED_RELEASE_TABLET | Freq: Every day | ORAL | 11 refills | Status: DC

## 2020-01-12 MED ORDER — LIDOCAINE HCL (PF) 1 % IJ SOLN
INTRAMUSCULAR | Status: AC
  Filled 2020-01-12: qty 30

## 2020-01-12 MED ORDER — SODIUM CHLORIDE 0.9 % WEIGHT BASED INFUSION: 3.0000 mL/kg/h | INTRAVENOUS | Status: AC

## 2020-01-12 MED ORDER — HEPARIN (PORCINE) IN NACL 1000-0.9 UT/500ML-% IV SOLN
INTRAVENOUS | Status: AC
  Filled 2020-01-12: qty 1000

## 2020-01-12 MED ORDER — ATORVASTATIN CALCIUM 80 MG PO TABS
80.0000 mg | ORAL_TABLET | Freq: Every day | ORAL | Status: DC
  Filled 2020-01-12: qty 1

## 2020-01-12 MED ORDER — ONDANSETRON HCL 4 MG/2ML IJ SOLN: 4.0000 mg | Freq: Four times a day (QID) | INTRAMUSCULAR | Status: DC | PRN

## 2020-01-12 MED ORDER — ASPIRIN EC 81 MG PO TBEC: 81.0000 mg | DELAYED_RELEASE_TABLET | Freq: Every day | ORAL | Status: DC

## 2020-01-12 MED ORDER — CLOPIDOGREL BISULFATE 75 MG PO TABS: 75.0000 mg | ORAL_TABLET | Freq: Every day | ORAL | 11 refills | Status: DC

## 2020-01-12 MED ORDER — SODIUM CHLORIDE 0.9 % IV SOLN: INTRAVENOUS | Status: AC

## 2020-01-12 MED ORDER — CLOPIDOGREL BISULFATE 75 MG PO TABS: 75.0000 mg | ORAL_TABLET | Freq: Every day | ORAL | Status: DC

## 2020-01-12 MED ORDER — MORPHINE SULFATE (PF) 2 MG/ML IV SOLN: 2.0000 mg | INTRAVENOUS | Status: DC | PRN

## 2020-01-12 MED ORDER — HEPARIN (PORCINE) IN NACL 1000-0.9 UT/500ML-% IV SOLN
INTRAVENOUS | Status: DC | PRN
  Administered 2020-01-12: 500 mL

## 2020-01-12 MED ORDER — HEPARIN SODIUM (PORCINE) 1000 UNIT/ML IJ SOLN
INTRAMUSCULAR | Status: AC
  Filled 2020-01-12: qty 1

## 2020-01-12 MED ORDER — SODIUM CHLORIDE 0.9% FLUSH: 3.0000 mL | INTRAVENOUS | Status: DC | PRN

## 2020-01-12 MED ORDER — CLOPIDOGREL BISULFATE 300 MG PO TABS
ORAL_TABLET | ORAL | Status: DC | PRN
  Administered 2020-01-12: 300 mg via ORAL

## 2020-01-12 MED ORDER — SODIUM CHLORIDE 0.9 % WEIGHT BASED INFUSION: 1.0000 mL/kg/h | INTRAVENOUS | Status: DC

## 2020-01-12 MED ORDER — SODIUM CHLORIDE 0.9 % IV SOLN: 250.0000 mL | INTRAVENOUS | Status: DC | PRN

## 2020-01-12 MED ORDER — SODIUM CHLORIDE 0.9% FLUSH: 3.0000 mL | Freq: Two times a day (BID) | INTRAVENOUS | Status: DC

## 2020-01-12 MED ORDER — LABETALOL HCL 5 MG/ML IV SOLN: 10.0000 mg | INTRAVENOUS | Status: DC | PRN

## 2020-01-12 MED ORDER — ASPIRIN 81 MG PO CHEW: 81.0000 mg | CHEWABLE_TABLET | ORAL | Status: DC

## 2020-01-12 MED ORDER — FENTANYL CITRATE (PF) 100 MCG/2ML IJ SOLN
INTRAMUSCULAR | Status: AC
  Filled 2020-01-12: qty 2

## 2020-01-12 MED ORDER — MIDAZOLAM HCL 2 MG/2ML IJ SOLN
INTRAMUSCULAR | Status: DC | PRN
  Administered 2020-01-12 (×2): 1 mg via INTRAVENOUS

## 2020-01-12 MED ORDER — HEPARIN SODIUM (PORCINE) 1000 UNIT/ML IJ SOLN
INTRAMUSCULAR | Status: DC | PRN
  Administered 2020-01-12: 10000 [IU] via INTRAVENOUS
  Administered 2020-01-12: 2500 [IU] via INTRAVENOUS
  Administered 2020-01-12: 3000 [IU] via INTRAVENOUS

## 2020-01-12 MED ORDER — LIDOCAINE HCL (PF) 1 % IJ SOLN
INTRAMUSCULAR | Status: DC | PRN
  Administered 2020-01-12: 25 mL

## 2020-01-12 MED FILL — CLOPIDOGREL 75 MG TABLET: 75 | 30 days supply | Qty: 30 | Fill #0

## 2020-01-12 MED FILL — ASPIRIN LOW DOSE 81 MG TBEC: 81 | 30 days supply | Qty: 30 | Fill #0

## 2020-01-12 SURGICAL SUPPLY — 33 items
BAG SNAP BAND KOVER 36X36 (MISCELLANEOUS) ×1 IMPLANT
BALLN COYOTE OTW 2.5X100X150 (BALLOONS) ×3
BALLN IN.PACT DCB 6X120 (BALLOONS) ×3
BALLOON COYOTE OTW 2.5X100X150 (BALLOONS) IMPLANT
CATH ANGIO 5F PIGTAIL 65CM (CATHETERS) ×1 IMPLANT
CATH CROSS OVER TEMPO 5F (CATHETERS) ×1 IMPLANT
CATH HAWKONE LX EXTENDED TIP (CATHETERS) ×1 IMPLANT
CATH NAVICROSS ST .035X135CM (MICROCATHETER) ×1 IMPLANT
CATH STRAIGHT 5FR 65CM (CATHETERS) ×1 IMPLANT
CATH VIANCE CROSS STAND 150CM (MICROCATHETER) ×3
CATH VIANCE CROSS STD 150CM (MICROCATHETER) IMPLANT
CLOSURE MYNX CONTROL 6F/7F (Vascular Products) ×1 IMPLANT
DCB IN.PACT 6X120 (BALLOONS) IMPLANT
DEVICE SPIDERFX EMB PROT 6MM (WIRE) ×1 IMPLANT
DEVICE TORQUE .014-.018 (MISCELLANEOUS) IMPLANT
GLIDEWIRE ANGLED SS 035X260CM (WIRE) ×1 IMPLANT
GUIDEWIRE ANGLED .035X150CM (WIRE) ×1 IMPLANT
GUIDEWIRE ZILIENT 12G 014X300 (WIRE) ×1 IMPLANT
KIT ENCORE 26 ADVANTAGE (KITS) ×1 IMPLANT
KIT PV (KITS) ×3 IMPLANT
SHEATH HIGHFLEX ANSEL 7FR 55CM (SHEATH) ×1 IMPLANT
SHEATH PINNACLE 5F 10CM (SHEATH) ×1 IMPLANT
SHEATH PINNACLE 7F 10CM (SHEATH) ×1 IMPLANT
SHEATH PROBE COVER 6X72 (BAG) ×1 IMPLANT
SYR MEDRAD MARK 7 150ML (SYRINGE) ×3 IMPLANT
TAPE VIPERTRACK RADIOPAQ (MISCELLANEOUS) IMPLANT
TAPE VIPERTRACK RADIOPAQUE (MISCELLANEOUS) ×3
TORQUE DEVICE .014-.018 (MISCELLANEOUS) ×3
TRANSDUCER W/STOPCOCK (MISCELLANEOUS) ×3 IMPLANT
TRAY PV CATH (CUSTOM PROCEDURE TRAY) ×3 IMPLANT
WIRE HITORQ VERSACORE ST 145CM (WIRE) ×1 IMPLANT
WIRE ROSEN-J .035X180CM (WIRE) ×1 IMPLANT
WIRE SPARTACORE .014X300CM (WIRE) ×1 IMPLANT

## 2020-01-12 NOTE — Interval H&P Note (Signed)
History and Physical Interval Note:  01/12/2020 7:32 AM  [name]  has presented today for surgery, with the diagnosis of pad.  The various methods of treatment have been discussed with the patient and family. After consideration of risks, benefits and other options for treatment, the patient has consented to  Procedure(s): ABDOMINAL AORTOGRAM W/LOWER EXTREMITY (N/A) as a surgical intervention.  The patient's history has been reviewed, patient examined, no change in status, stable for surgery.  I have reviewed the patient's chart and labs.  Questions were answered to the patient's satisfaction.     Quay Burow

## 2020-01-12 NOTE — Discharge Instructions (Signed)
Angiogram, Care After This sheet gives you information about how to care for yourself after your procedure. Your health care provider may also give you more specific instructions. If you have problems or questions, contact your health care provider. What can I expect after the procedure? After the procedure, it is common to have bruising and tenderness at the catheter insertion area. Follow these instructions at home: Insertion site care  Follow instructions from your health care provider about how to take care of your insertion site. Make sure you: ? Wash your hands with soap and water before you change your bandage (dressing). If soap and water are not available, use hand sanitizer. ? Change your dressing as told by your health care provider. ? Leave stitches (sutures), skin glue, or adhesive strips in place. These skin closures may need to stay in place for 2 weeks or longer. If adhesive strip edges start to loosen and curl up, you may trim the loose edges. Do not remove adhesive strips completely unless your health care provider tells you to do that.  Do not take baths, swim, or use a hot tub until your health care provider approves.  You may shower 24-48 hours after the procedure or as told by your health care provider. ? Gently wash the site with plain soap and water. ? Pat the area dry with a clean towel. ? Do not rub the site. This may cause bleeding.  Do not apply powder or lotion to the site. Keep the site clean and dry.  Check your insertion site every day for signs of infection. Check for: ? Redness, swelling, or pain. ? Fluid or blood. ? Warmth. ? Pus or a bad smell. Activity  Rest as told by your health care provider, usually for 1-2 days.  Do not lift anything that is heavier than 10 lbs. (4.5 kg) or as told by your health care provider.  Do not drive for 24 hours if you were given a medicine to help you relax (sedative).  Do not drive or use heavy machinery while  taking prescription pain medicine. General instructions   Return to your normal activities as told by your health care provider, usually in about a week. Ask your health care provider what activities are safe for you.  If the catheter site starts bleeding, lie flat and put pressure on the site. If the bleeding does not stop, get help right away. This is a medical emergency.  Drink enough fluid to keep your urine clear or pale yellow. This helps flush the contrast dye from your body.  Take over-the-counter and prescription medicines only as told by your health care provider.  Keep all follow-up visits as told by your health care provider. This is important. Contact a health care provider if:  You have a fever or chills.  You have redness, swelling, or pain around your insertion site.  You have fluid or blood coming from your insertion site.  The insertion site feels warm to the touch.  You have pus or a bad smell coming from your insertion site.  You have bruising around the insertion site.  You notice blood collecting in the tissue around the catheter site (hematoma). The hematoma may be painful to the touch. Get help right away if:  You have severe pain at the catheter insertion area.  The catheter insertion area swells very fast.  The catheter insertion area is bleeding, and the bleeding does not stop when you hold steady pressure on the area.    The area near or just beyond the catheter insertion site becomes pale, cool, tingly, or numb. These symptoms may represent a serious problem that is an emergency. Do not wait to see if the symptoms will go away. Get medical help right away. Call your local emergency services (911 in the [place] Do not drive yourself to the hospital. Summary  After the procedure, it is common to have bruising and tenderness at the catheter insertion area.  After the procedure, it is important to rest and drink plenty of fluids.  Do not take baths,  swim, or use a hot tub until your health care provider says it is okay to do so. You may shower 24-48 hours after the procedure or as told by your health care provider.  If the catheter site starts bleeding, lie flat and put pressure on the site. If the bleeding does not stop, get help right away. This is a medical emergency. This information is not intended to replace advice given to you by your health care provider. Make sure you discuss any questions you have with your health care provider. Document Revised: 09/18/2017 Document Reviewed: 09/10/2016 Elsevier Patient Education  2020 Elsevier Inc.  

## 2020-01-12 NOTE — Progress Notes (Signed)
Spoke with Vin PA // cards,x 2  Regarding meds and pt has minimal oozing from left fem site, level 73m hold 1 more additional hour, Vin PA will correct meds,

## 2020-01-14 ENCOUNTER — Ambulatory Visit: Payer: BC Managed Care – PPO | Attending: Internal Medicine

## 2020-01-14 DIAGNOSIS — Z23 Encounter for immunization: Secondary | ICD-10-CM

## 2020-01-14 NOTE — Progress Notes (Signed)
   Covid-19 Vaccination Clinic  Name:  Curtis Patton    MRN: [id number] DOB: 1966/09/25  01/14/2020  Curtis Patton was observed post Covid-19 immunization for 15 minutes without incident. He was provided with Vaccine Information Sheet and instruction to access the V-Safe system.   Curtis Patton was instructed to call 911 with any severe reactions post vaccine: Marland Kitchen Difficulty breathing  . Swelling of face and throat  . A fast heartbeat  . A bad rash all over body  . Dizziness and weakness   Immunizations Administered    Name Date Dose VIS Date Route   Pfizer COVID-19 Vaccine 01/14/2020  8:57 AM 0.3 mL 09/30/2019 Intramuscular   Manufacturer: [place]   Lot: [id number]   [place]: [id number]

## 2020-01-19 ENCOUNTER — Encounter (HOSPITAL_COMMUNITY): Payer: BC Managed Care – PPO

## 2020-01-20 ENCOUNTER — Ambulatory Visit (HOSPITAL_COMMUNITY)
Admission: RE | Admit: 2020-01-20 | Discharge: 2020-01-20 | Disposition: A | Discharge status: Home / Self Care | Payer: BC Managed Care – PPO | Source: Ambulatory Visit | Attending: Cardiology | Admitting: Cardiology

## 2020-01-20 ENCOUNTER — Other Ambulatory Visit: Payer: Self-pay | Admitting: Cardiovascular Disease

## 2020-01-20 ENCOUNTER — Other Ambulatory Visit: Payer: Self-pay

## 2020-01-20 DIAGNOSIS — I739 Peripheral vascular disease, unspecified: Secondary | ICD-10-CM | POA: Insufficient documentation

## 2020-01-20 DIAGNOSIS — Z9862 Peripheral vascular angioplasty status: Secondary | ICD-10-CM

## 2020-01-27 ENCOUNTER — Ambulatory Visit (INDEPENDENT_AMBULATORY_CARE_PROVIDER_SITE_OTHER): Payer: BC Managed Care – PPO | Admitting: Cardiovascular Disease

## 2020-01-27 ENCOUNTER — Encounter: Payer: Self-pay | Admitting: Cardiovascular Disease

## 2020-01-27 ENCOUNTER — Other Ambulatory Visit: Payer: Self-pay

## 2020-01-27 DIAGNOSIS — I739 Peripheral vascular disease, unspecified: Secondary | ICD-10-CM

## 2020-01-27 NOTE — Assessment & Plan Note (Signed)
History of PAD status post peripheral angiography and intervention by myself 01/12/2020 with a mid right SFA CTO which I performed directional arthrectomy on and drug-coated balloon angioplasty.  He had two-vessel runoff with an occluded anterior tibial.  He did have a focal 90% mid left SFA stenosis although he is not symptomatic on that side.  Follow-up Dopplers performed 01/20/2020 showed normal right ABI with normal velocities.  His claudication completely resolved.  He has stopped smoking since that time.

## 2020-01-27 NOTE — Patient Instructions (Signed)
Medication Instructions:  Your physician recommends that you continue on your current medications as directed. Please refer to the Current Medication list given to you today.  *If you need a refill on your cardiac medications before your next appointment, please call your pharmacy*  Lab Work: NONE  Testing/Procedures: Your physician has requested that you have an ankle brachial index (ABI) in 6 MONTHS. During this test an ultrasound and blood pressure cuff are used to evaluate the arteries that supply the arms and legs with blood. Allow thirty minutes for this exam. There are no restrictions or special instructions.  Your physician has requested that you have a lower extremity arterial duplex in 6 MONTHS. This test is an ultrasound of the arteries in the legs. It looks at arterial blood flow in the legs. Allow one hour for Lower Arterial scans. There are no restrictions or special instructions  Follow-Up: At [facility], you and your health needs are our priority.  As part of our continuing mission to provide you with exceptional heart care, we have created designated Provider Care Teams.  These Care Teams include your primary Cardiologist (physician) and Advanced Practice Providers (APPs -  Physician Assistants and Nurse Practitioners) who all work together to provide you with the care you need, when you need it.  We recommend signing up for the patient portal called "MyChart".  Sign up information is provided on this After Visit Summary.  MyChart is used to connect with patients for Virtual Visits (Telemedicine).  Patients are able to view lab/test results, encounter notes, upcoming appointments, etc.  Non-urgent messages can be sent to your provider as well.   To learn more about what you can do with MyChart, go to [email or web address].    Your next appointment:   12 month(s)  The format for your next appointment:   In Person  Provider:   Quay Burow, MD

## 2020-01-27 NOTE — Progress Notes (Signed)
01/27/2020 Curtis Patton   December 21, 1965  [id number]  Primary Physician Billie Ruddy, MD Primary Cardiologist: Lorretta Harp MD [name], [name]  HPI:  Curtis Patton is a 54 y.o.   mild to moderately overweight separated African-American male father of 58 children, grandfather of 3 grandchildren who works for [employer].  Is referred to me by Dr. Audie Box , his cardiologist, for peripheral vascular evaluation and treatment because of right calf lifestyle limiting claudication.  I last saw him in the office 01/04/2020. He does have a history of treated hypertension hyperlipidemia.  He denies chest pain or shortness of breath.  Is never had a heart attack or stroke.  Does have a history of tobacco abuse as well as heart failure with preserved EF.  He has noticed right calf claudication over the last several months which is lifestyle limiting.  Dopplers performed 12/28/2019 revealed a right ankle-brachial index of 0.74 and a left of 1.02 with a short segment occlusion mid right SFA.  I performed angiography on him 01/12/2020 revealing a mid right SFA CTO with two-vessel runoff.  He had a focal 90% mid left SFA stenosis with two-vessel runoff although he is asymptomatic on that side.  I performed [facility] 1 directional atherectomy followed by drug-coated balloon angioplasty.  His claudication is resolved and his Dopplers are normalized.  Of note, he did stop smoking at that time.   Current Meds  Medication Sig  . amLODipine (NORVASC) 10 MG tablet Take 1 tablet (10 mg total) by mouth daily.  Marland Kitchen aspirin EC 81 MG EC tablet Take 1 tablet (81 mg total) by mouth daily.  . carvedilol (COREG) 25 MG tablet Take 1 tablet (25 mg total) by mouth 2 (two) times daily with a meal.  . clopidogrel (PLAVIX) 75 MG tablet Take 1 tablet (75 mg total) by mouth daily with breakfast.  . losartan (COZAAR) 50 MG tablet Take 1 tablet (50 mg total) by mouth daily.  . Menthol, Topical Analgesic, (ICY  HOT EX) Apply 1 application topically 4 (four) times daily as needed (pain.).  Marland Kitchen propranolol (INDERAL) 10 MG tablet Take 10 mg by mouth 2 (two) times daily.  . rosuvastatin (CRESTOR) 20 MG tablet Take 1 tablet (20 mg total) by mouth daily.  . Saw Palmetto, Serenoa repens, (SAW PALMETTO PO) Take 2 tablets by mouth daily.  . sildenafil (VIAGRA) 25 MG tablet Take 1 tablet (25 mg total) by mouth daily as needed for erectile dysfunction.  . [DISCONTINUED] predniSONE (DELTASONE) 50 MG tablet TAKE 1 TABLET THIRTEEN HOURS PRIOR TO CATH, 1 TABLET 7 HOURS PRIOR TO CATH AND 1 TABLET ONE HOUR PRIOR TO CATH     Allergies  Allergen Reactions  . Contrast Media [Iodinated Diagnostic Agents] Hives    Social History   Socioeconomic History  . Marital status: Married    Spouse name: Not on file  . Number of children: Not on file  . Years of education: Not on file  . Highest education level: Not on file  Occupational History  . Not on file  Tobacco Use  . Smoking status: Current Some Day Smoker    Packs/day: 1.00    Types: Cigars  . Smokeless tobacco: Never Used  Substance and Sexual Activity  . Alcohol use: Yes    Alcohol/week: 6.0 standard drinks    Types: 4 Cans of beer, 2 Shots of liquor per week  . Drug use: No  . Sexual activity: Not Currently  Partners: Female  Other Topics Concern  . Not on file  Social History Narrative  . Not on file   Social Determinants of Health   Financial Resource Strain:   . Difficulty of Paying Living Expenses:   Food Insecurity:   . Worried About Charity fundraiser in the Last Year:   . Arboriculturist in the Last Year:   Transportation Needs:   . Film/video editor (Medical):   Marland Kitchen Lack of Transportation (Non-Medical):   Physical Activity:   . Days of Exercise per Week:   . Minutes of Exercise per Session:   Stress:   . Feeling of Stress :   Social Connections:   . Frequency of Communication with Friends and Family:   . Frequency of Social  Gatherings with Friends and Family:   . Attends Religious Services:   . Active Member of Clubs or Organizations:   . Attends Archivist Meetings:   Marland Kitchen Marital Status:   Intimate Partner Violence:   . Fear of Current or Ex-Partner:   . Emotionally Abused:   Marland Kitchen Physically Abused:   . Sexually Abused:      Review of Systems: General: negative for chills, fever, night sweats or weight changes.  Cardiovascular: negative for chest pain, dyspnea on exertion, edema, orthopnea, palpitations, paroxysmal nocturnal dyspnea or shortness of breath Dermatological: negative for rash Respiratory: negative for cough or wheezing Urologic: negative for hematuria Abdominal: negative for nausea, vomiting, diarrhea, bright red blood per rectum, melena, or hematemesis Neurologic: negative for visual changes, syncope, or dizziness All other systems reviewed and are otherwise negative except as noted above.    Blood pressure 122/74, pulse 78, height 6\' 2"  (1.88 m), weight 221 lb (100.2 kg), SpO2 95 %.  General appearance: alert and no distress Neck: no adenopathy, no carotid bruit, no JVD, supple, symmetrical, trachea midline and thyroid not enlarged, symmetric, no tenderness/mass/nodules Lungs: clear to auscultation bilaterally Heart: regular rate and rhythm, S1, S2 normal, no murmur, click, rub or gallop Extremities: extremities normal, atraumatic, no cyanosis or edema Pulses: 2+ and symmetric Skin: Skin color, texture, turgor normal. No rashes or lesions Neurologic: Alert and oriented X 3, normal strength and tone. Normal symmetric reflexes. Normal coordination and gait  EKG sinus rhythm 78 with left axis deviation.  I personally reviewed this EKG.  ASSESSMENT AND PLAN:   Peripheral arterial disease ([place]) History of PAD status post peripheral angiography and intervention by myself 01/12/2020 with a mid right SFA CTO which I performed directional arthrectomy on and drug-coated balloon  angioplasty.  He had two-vessel runoff with an occluded anterior tibial.  He did have a focal 90% mid left SFA stenosis although he is not symptomatic on that side.  Follow-up Dopplers performed 01/20/2020 showed normal right ABI with normal velocities.  His claudication completely resolved.  He has stopped smoking since that time.      Lorretta Harp MD FACP,FACC,FAHA, [facility] 01/27/2020 5:02 PM

## 2020-03-15 ENCOUNTER — Other Ambulatory Visit: Payer: Self-pay

## 2020-03-15 ENCOUNTER — Encounter: Payer: Self-pay | Admitting: Cardiovascular Disease

## 2020-03-15 ENCOUNTER — Ambulatory Visit: Payer: BC Managed Care – PPO | Admitting: Cardiovascular Disease

## 2020-03-15 VITALS — BP 136/79 | HR 74 | Ht 74.0 in | Wt 229.8 lb

## 2020-03-15 DIAGNOSIS — I10 Essential (primary) hypertension: Secondary | ICD-10-CM | POA: Diagnosis not present

## 2020-03-15 DIAGNOSIS — I5032 Chronic diastolic (congestive) heart failure: Secondary | ICD-10-CM

## 2020-03-15 DIAGNOSIS — I739 Peripheral vascular disease, unspecified: Secondary | ICD-10-CM

## 2020-03-15 DIAGNOSIS — E782 Mixed hyperlipidemia: Secondary | ICD-10-CM | POA: Diagnosis not present

## 2020-03-15 DIAGNOSIS — G43809 Other migraine, not intractable, without status migrainosus: Secondary | ICD-10-CM

## 2020-03-15 MED ORDER — SUMATRIPTAN SUCCINATE 25 MG PO TABS: 25.0000 mg | ORAL_TABLET | ORAL | 0 refills | Status: DC | PRN

## 2020-03-15 NOTE — Patient Instructions (Addendum)
Medication Instructions:  Take Imitrex 25 mg as needed for migraines.   *If you need a refill on your cardiac medications before your next appointment, please call your pharmacy*   Lab Work: LIPID If you have labs (blood work) drawn today and your tests are completely normal, you will receive your results only by: Marland Kitchen MyChart Message (if you have MyChart) OR . A paper copy in the mail If you have any lab test that is abnormal or we need to change your treatment, we will call you to review the results.   Follow-Up: At [facility], you and your health needs are our priority.  As part of our continuing mission to provide you with exceptional heart care, we have created designated Provider Care Teams.  These Care Teams include your primary Cardiologist (physician) and Advanced Practice Providers (APPs -  Physician Assistants and Nurse Practitioners) who all work together to provide you with the care you need, when you need it.  We recommend signing up for the patient portal called "MyChart".  Sign up information is provided on this After Visit Summary.  MyChart is used to connect with patients for Virtual Visits (Telemedicine).  Patients are able to view lab/test results, encounter notes, upcoming appointments, etc.  Non-urgent messages can be sent to your provider as well.   To learn more about what you can do with MyChart, go to [email or web address].    Your next appointment:   6 month(s)  The format for your next appointment:   In Person  Provider:   Eleonore Chiquito, MD

## 2020-03-15 NOTE — Progress Notes (Signed)
Cardiology Office Note:   Date:  03/15/2020  NAME:  Curtis Patton    MRN: [id number] DOB:  05/14/1966   PCP:  Billie Ruddy, MD  Cardiologist:  Evalina Field, MD   Referring MD: Billie Ruddy, MD   Chief Complaint  Patient presents with   PAD   History of Present Illness:   Curtis Patton is a 54 y.o. male with a hx of PAD, HFpEF, HTN, HLD who presents for follow-up. Had significant claudication and [place] confirmed. Underwent angioplasty of the R SFA with Dr. Gwenlyn Found. Doing well and symptoms resolved. Needs aggressive risk factor modification.   He reports he is doing well since his recent peripheral intervention.  He reports no further claudication symptoms in the right leg.  He describes no leg pain in the left leg.  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  He has no limitations such as chest pain or shortness of breath.  He has no symptoms of claudication either.  He seems to be doing quite well.  He has since quit smoking and I congratulated him on this.  He is on Crestor and we have not rechecked a lipid profile.  He is not fasting today but we will do 1 in the next few weeks.  He is starting to work on diet and exercise.  He does need to lose some weight and he will work on this.  He reports that he is having issues with migraines.  He does not have a prescription for his Imitrex.  He would like one today.  We will provide one for him.  Overall doing well.  His blood pressure is well controlled.  He seems to be doing well.  We need to continue with aggressive risk factor modification.  He denies chest pain, shortness of breath or palpitations in office today.  Problem List 1. HFpEF  -EF 50-55% 2. HTN 3. HLD -T chol 203, HDL 45, LDL 136, TG 124 4. Tobacco Abuse  5. PAD -R SFA CTO s/p atherectomy/angioplasty  -90% mid L SFA (asymptomatic)  Past Medical History: Past Medical History:  Diagnosis Date   (HFpEF) heart failure with  preserved ejection fraction ([place])    a. Echo 08/10/2018: LVEF 50-55% w/ mild LVH, mild diffuse hypokinesis w/ no regional variation, and G1DD   Acute CHF ([place])    Acute renal insufficiency    Acute respiratory failure with hypoxia ([place])    Hypertension    Migraine     Past Surgical History: Past Surgical History:  Procedure Laterality Date   ABDOMINAL AORTOGRAM W/LOWER EXTREMITY N/A 01/12/2020   Procedure: ABDOMINAL AORTOGRAM W/LOWER EXTREMITY;  Surgeon: Lorretta Harp, MD;  Location: [facility];  Service: Cardiovascular;  Laterality: N/A;   JOINT REPLACEMENT     neck     PERIPHERAL VASCULAR ATHERECTOMY  01/12/2020   Procedure: PERIPHERAL VASCULAR ATHERECTOMY;  Surgeon: Lorretta Harp, MD;  Location: [facility];  Service: Cardiovascular;;  RT SFA w/DCB    ROTATOR CUFF REPAIR     SPINE SURGERY      Current Medications: Current Meds  Medication Sig   amLODipine (NORVASC) 10 MG tablet Take 1 tablet (10 mg total) by mouth daily.   aspirin EC 81 MG EC tablet Take 1 tablet (81 mg total) by mouth daily.   carvedilol (COREG) 25 MG tablet Take 1 tablet (25 mg total) by mouth 2 (two) times daily with  a meal.   clopidogrel (PLAVIX) 75 MG tablet Take 1 tablet (75 mg total) by mouth daily with breakfast.   losartan (COZAAR) 50 MG tablet Take 1 tablet (50 mg total) by mouth daily.   Menthol, Topical Analgesic, (ICY HOT EX) Apply 1 application topically 4 (four) times daily as needed (pain.).   propranolol (INDERAL) 10 MG tablet Take 10 mg by mouth 2 (two) times daily.   rosuvastatin (CRESTOR) 20 MG tablet Take 1 tablet (20 mg total) by mouth daily.   Saw Palmetto, Serenoa repens, (SAW PALMETTO PO) Take 2 tablets by mouth daily.   sildenafil (VIAGRA) 25 MG tablet Take 1 tablet (25 mg total) by mouth daily as needed for erectile dysfunction.     Allergies:    Contrast media [iodinated diagnostic agents]   Social History: Social History    Socioeconomic History   Marital status: Married    Spouse name: Not on file   Number of children: Not on file   Years of education: Not on file   Highest education level: Not on file  Occupational History   Not on file  Tobacco Use   Smoking status: Current Some Day Smoker    Packs/day: 1.00    Types: Cigars   Smokeless tobacco: Never Used  Substance and Sexual Activity   Alcohol use: Yes    Alcohol/week: 6.0 standard drinks    Types: 4 Cans of beer, 2 Shots of liquor per week   Drug use: No   Sexual activity: Not Currently    Partners: Female  Other Topics Concern   Not on file  Social History Narrative   Not on file   Social Determinants of Health   Financial Resource Strain:    Difficulty of Paying Living Expenses:   Food Insecurity:    Worried About Charity fundraiser in the Last Year:    Arboriculturist in the Last Year:   Transportation Needs:    Film/video editor (Medical):    Lack of Transportation (Non-Medical):   Physical Activity:    Days of Exercise per Week:    Minutes of Exercise per Session:   Stress:    Feeling of Stress :   Social Connections:    Frequency of Communication with Friends and Family:    Frequency of Social Gatherings with Friends and Family:    Attends Religious Services:    Active Member of Clubs or Organizations:    Attends Music therapist:    Marital Status:      Family History: The patient's family history includes Hypertension in his maternal grandmother, mother, and another family member; Sudden death in his paternal grandmother. There is no history of Colon cancer, Colon polyps, Esophageal cancer, Rectal cancer, or Stomach cancer.  ROS:   All other ROS reviewed and negative. Pertinent positives noted in the HPI.     EKGs/Labs/Other Studies Reviewed:   The following studies were personally reviewed by me today:  Vasc US 01/20/2020 Summary:  Right: Resting right  ankle-brachial index is within normal range. No  evidence of significant right lower extremity arterial disease. The right  toe-brachial index is normal.   Left: Resting left ankle-brachial index is within normal range. No  evidence of significant left lower extremity arterial disease. The left  toe-brachial index is normal.   TTE 08/10/2018 - Left ventricle: The cavity size was normal. There was mild  concentric hypertrophy. Systolic function was at the lower limits  of normal. The  estimated ejection fraction was in the range of  50% to 55%. Mild diffuse hypokinesis with no identifiable  regional variations. Doppler parameters are consistent with  abnormal left ventricular relaxation (grade 1 diastolic  dysfunction).   Recent Labs: 06/22/2019: ALT 26; TSH 0.727 01/04/2020: BUN 11; Creatinine, Ser 0.94; Hemoglobin 16.0; Platelets 282; Potassium 4.9; Sodium 139   Recent Lipid Panel    Component Value Date/Time   CHOL 203 (H) 06/22/2019 1008   TRIG 124 06/22/2019 1008   HDL 45 06/22/2019 1008   CHOLHDL 4.5 06/22/2019 1008   LDLCALC 136 (H) 06/22/2019 1008    Physical Exam:   VS:  BP 136/79    Pulse 74    Ht 6\' 2"  (1.88 m)    Wt 229 lb 12.8 oz (104.2 kg)    SpO2 98%    BMI 29.50 kg/m    Wt Readings from Last 3 Encounters:  03/15/20 229 lb 12.8 oz (104.2 kg)  01/27/20 221 lb (100.2 kg)  01/12/20 218 lb (98.9 kg)    General: Well nourished, well developed, in no acute distress Heart: Atraumatic, normal size  Eyes: PEERLA, EOMI  Neck: Supple, no JVD Endocrine: No thryomegaly Cardiac: Normal S1, S2; RRR; no murmurs, rubs, or gallops Lungs: Clear to auscultation bilaterally, no wheezing, rhonchi or rales  Abd: Soft, nontender, no hepatomegaly  Ext: No edema, pulses 2+ Musculoskeletal: No deformities, BUE and BLE strength normal and equal Skin: Warm and dry, no rashes   Neuro: Alert and oriented to person, place, time, and situation, CNII-XII grossly intact, no focal  deficits  Psych: Normal mood and affect   ASSESSMENT:   Curtis Patton is a 54 y.o. male who presents for the following: 1. Peripheral arterial disease ([place])   2. Mixed hyperlipidemia   3. Essential hypertension   4. Chronic diastolic heart failure (HCC)   5. Other migraine without status migrainosus, not intractable     PLAN:   1. Peripheral arterial disease ([place]) -Status post angioplasty to the right SFA.  ABIs and vascular ultrasounds have improved.  He has no further claudication symptoms.  He does have 90% left SFA stenosis but no symptoms.  We will continue with risk factor modification.  He has quit smoking.  This is great.  This will help.  He will continue aspirin Plavix.  -He will be followed by Dr. Alvester Chou for this.  2. Mixed hyperlipidemia -Need to get his LDL less than 70.  Last year LDL value not at goal.  He is not fasting today.  We will continue Crestor 20 mg daily for now.  We will recheck lipid profile in the next 1 to 2 weeks.  I will follow-up the results of this by phone with him.  3. Essential hypertension -Continue current medications  4. Chronic diastolic heart failure (HCC) -No symptoms of heart failure  5. Other migraine without status migrainosus, not intractable -Sumatriptan prescription provided   Disposition: Return in about 6 months (around 09/15/2020).  Medication Adjustments/Labs and Tests Ordered: Current medicines are reviewed at length with the patient today.  Concerns regarding medicines are outlined above.  Orders Placed This Encounter  Procedures   Lipid panel   Meds ordered this encounter  Medications   SUMAtriptan (IMITREX) 25 MG tablet    Sig: Take 1 tablet (25 mg total) by mouth every 2 (two) hours as needed for migraine. May repeat in 2 hours if headache persists or recurs.    Dispense:  10 tablet  Refill:  0    Patient Instructions  Medication Instructions:  Take Imitrex 25 mg as needed for migraines.   *If you  need a refill on your cardiac medications before your next appointment, please call your pharmacy*   Lab Work: LIPID If you have labs (blood work) drawn today and your tests are completely normal, you will receive your results only by:  [place] (if you have MyChart) OR  A paper copy in the mail If you have any lab test that is abnormal or we need to change your treatment, we will call you to review the results.   Follow-Up: At [facility], you and your health needs are our priority.  As part of our continuing mission to provide you with exceptional heart care, we have created designated Provider Care Teams.  These Care Teams include your primary Cardiologist (physician) and Advanced Practice Providers (APPs -  Physician Assistants and Nurse Practitioners) who all work together to provide you with the care you need, when you need it.  We recommend signing up for the patient portal called "MyChart".  Sign up information is provided on this After Visit Summary.  MyChart is used to connect with patients for Virtual Visits (Telemedicine).  Patients are able to view lab/test results, encounter notes, upcoming appointments, etc.  Non-urgent messages can be sent to your provider as well.   To learn more about what you can do with MyChart, go to [email or web address].    Your next appointment:   6 month(s)  The format for your next appointment:   In Person  Provider:   Eleonore Chiquito, MD        Time Spent with Patient: I have spent a total of 35 minutes with patient reviewing hospital notes, telemetry, EKGs, labs and examining the patient as well as establishing an assessment and plan that was discussed with the patient.  > 50% of time was spent in direct patient care.  Signed, Addison Naegeli. Audie Box, [place]  [address], [place], [place] [address] [phone]  03/15/2020 5:53 PM

## 2020-03-23 ENCOUNTER — Other Ambulatory Visit: Payer: Self-pay | Admitting: Cardiovascular Disease

## 2020-03-23 ENCOUNTER — Telehealth: Payer: Self-pay | Admitting: Cardiovascular Disease

## 2020-03-23 MED ORDER — SUMATRIPTAN SUCCINATE 25 MG PO TABS: ORAL_TABLET | ORAL | 0 refills | Status: DC

## 2020-03-23 NOTE — Telephone Encounter (Signed)
Refill sent to pharmacy, patient will need to see primary care physician for further refills of Imitrex.

## 2020-03-23 NOTE — Telephone Encounter (Signed)
New message    *STAT* If patient is at the pharmacy, call can be transferred to refill team.   1. Which medications need to be refilled? (please list name of each medication and dose if known) SUMAtriptan (IMITREX) 25 MG tablet  2. Which pharmacy/location (including street and city if local pharmacy) is medication to be sent to?[facility], [address]  3. Do they need a 30 day or 90 day supply? [place]

## 2020-04-26 ENCOUNTER — Other Ambulatory Visit: Payer: Self-pay | Admitting: Family Medicine

## 2020-04-26 DIAGNOSIS — I5033 Acute on chronic diastolic (congestive) heart failure: Secondary | ICD-10-CM

## 2020-04-26 DIAGNOSIS — I10 Essential (primary) hypertension: Secondary | ICD-10-CM

## 2020-06-26 ENCOUNTER — Other Ambulatory Visit: Payer: Self-pay | Admitting: Cardiovascular Disease

## 2020-06-26 DIAGNOSIS — I10 Essential (primary) hypertension: Secondary | ICD-10-CM

## 2020-06-26 DIAGNOSIS — I5033 Acute on chronic diastolic (congestive) heart failure: Secondary | ICD-10-CM

## 2020-07-05 ENCOUNTER — Other Ambulatory Visit: Payer: Self-pay | Admitting: Cardiovascular Disease

## 2020-07-05 DIAGNOSIS — I5033 Acute on chronic diastolic (congestive) heart failure: Secondary | ICD-10-CM

## 2020-07-05 DIAGNOSIS — I10 Essential (primary) hypertension: Secondary | ICD-10-CM

## 2020-07-23 IMAGING — DX DG CHEST 2V
2 series · 2 of 2 positions shown · non-contrast
Comparison: None.

CLINICAL DATA: Chest pain, shortness of breath for 5 days.

EXAM:
CHEST - 2 VIEW

[w chest pa]
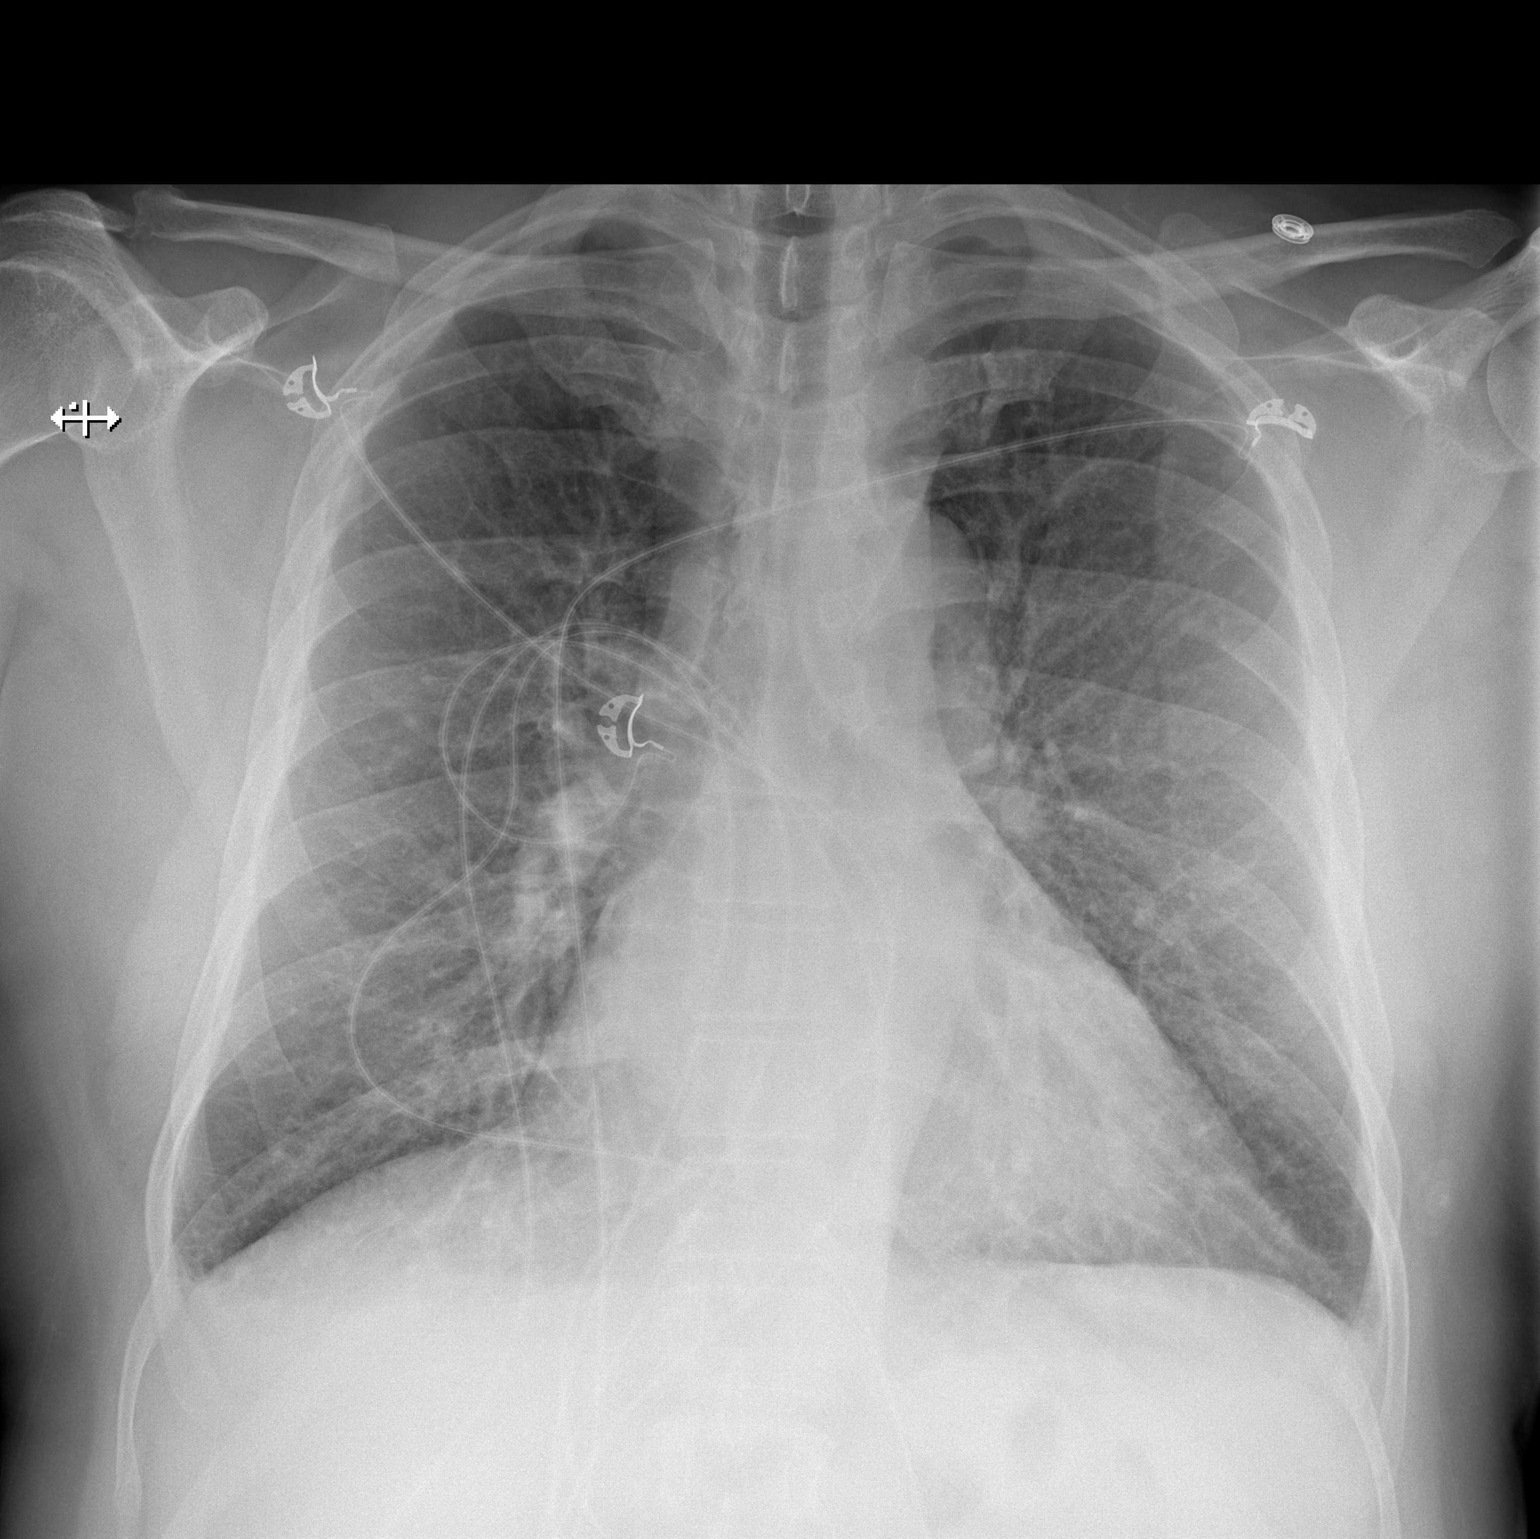

[w chest lat]
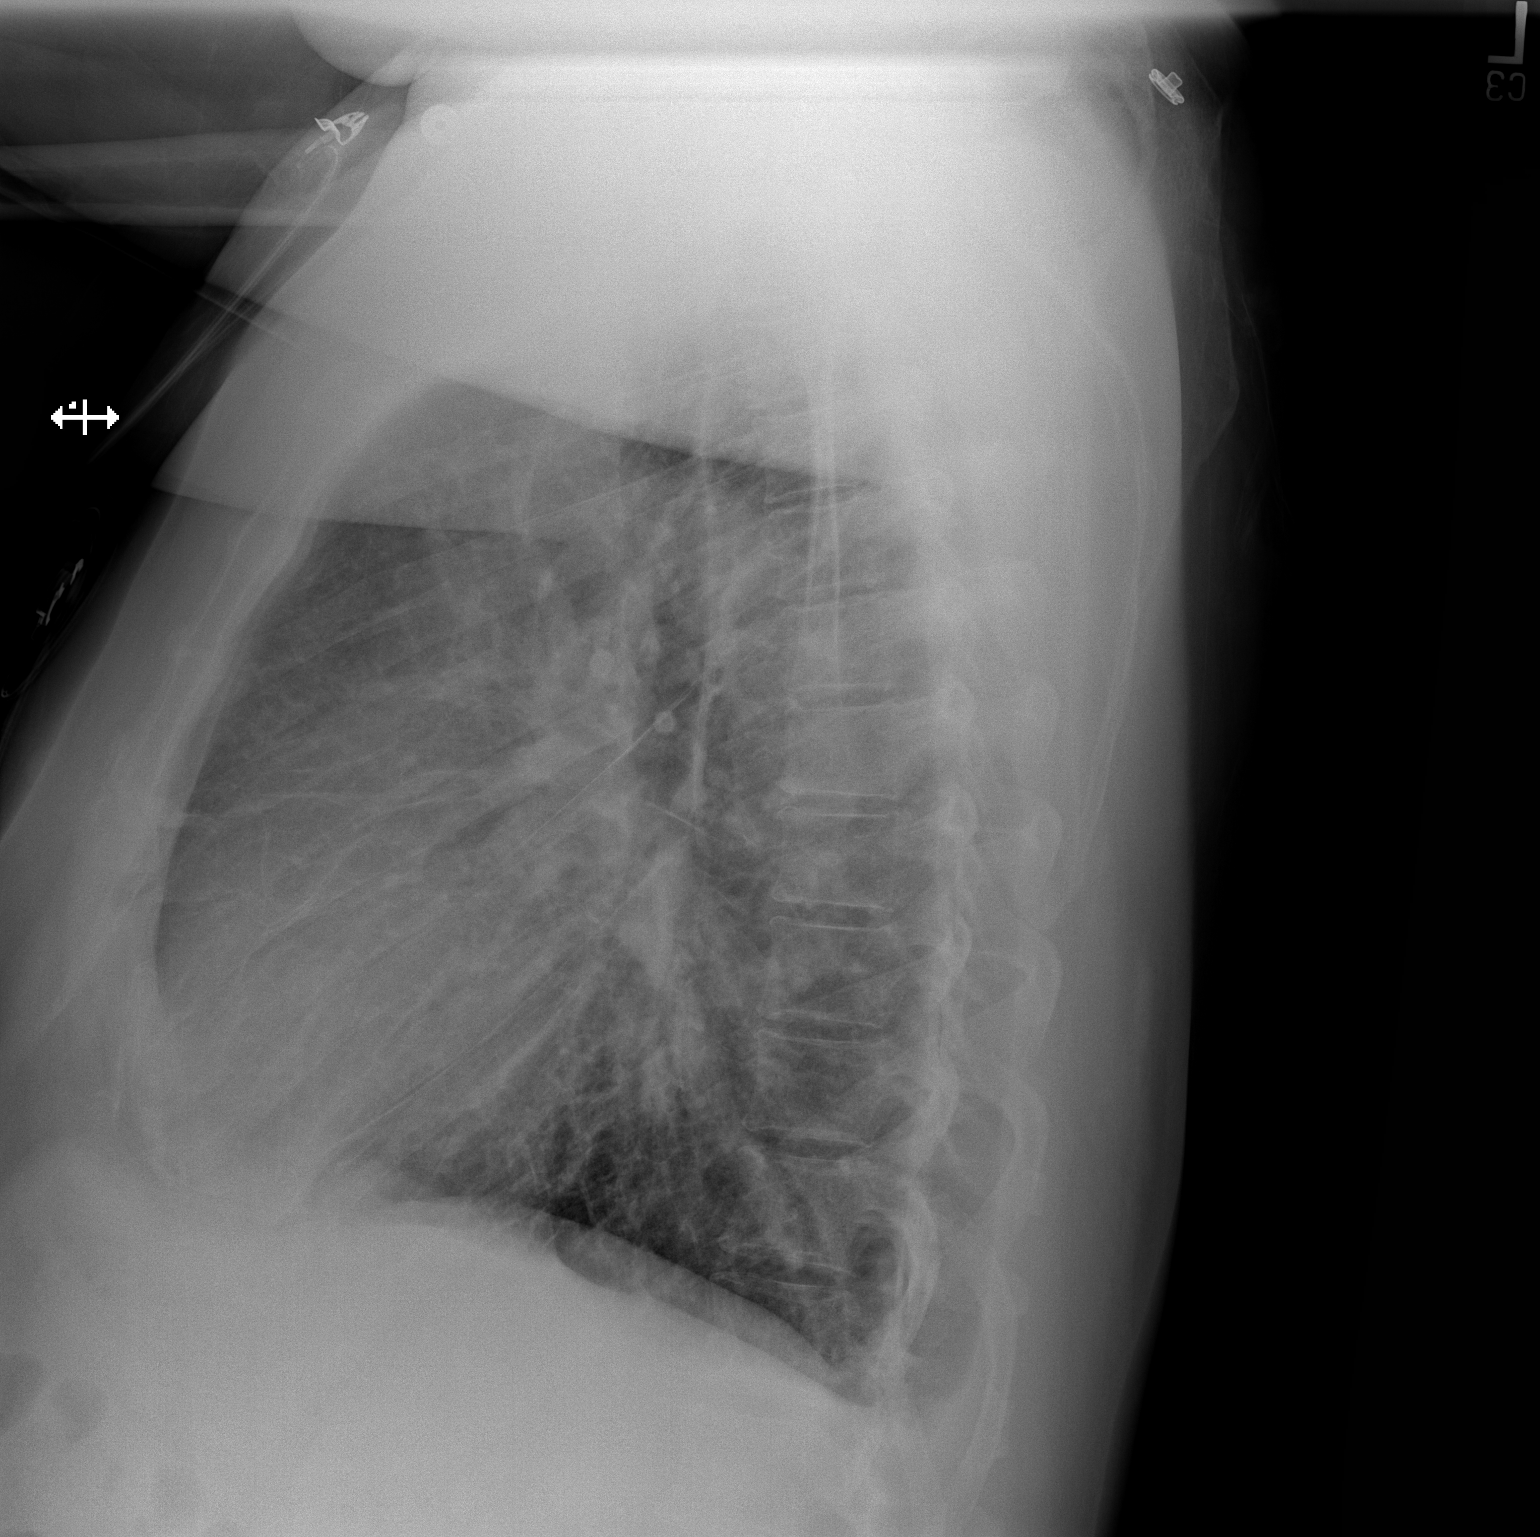

[2 of 2 positions shown; findings below may reference images not displayed]

FINDINGS: Borderline cardiomegaly. Overall cardiomediastinal silhouette is
within normal limits. Mildly prominent interstitial markings
bilaterally suggesting edema versus chronic interstitial lung
disease. No confluent opacity to suggest a consolidating pneumonia.
No pleural effusion or pneumothorax seen. No acute or suspicious
osseous finding.
IMPRESSION: 1. Mildly prominent interstitial markings bilaterally. Without prior
films for comparison, this could represent either acute interstitial
edema or chronic interstitial lung disease. No evidence of overt
alveolar pulmonary edema. No evidence of pneumonia.
2. Borderline cardiomegaly.

## 2020-07-24 ENCOUNTER — Other Ambulatory Visit (HOSPITAL_COMMUNITY): Payer: Self-pay | Admitting: Cardiovascular Disease

## 2020-07-24 ENCOUNTER — Ambulatory Visit (HOSPITAL_COMMUNITY)
Admission: RE | Admit: 2020-07-24 | Discharge: 2020-07-24 | Disposition: A | Discharge status: Home / Self Care | Payer: BC Managed Care – PPO | Source: Ambulatory Visit | Attending: Cardiovascular Disease | Admitting: Cardiovascular Disease

## 2020-07-24 ENCOUNTER — Other Ambulatory Visit: Payer: Self-pay

## 2020-07-24 DIAGNOSIS — Z9862 Peripheral vascular angioplasty status: Secondary | ICD-10-CM | POA: Diagnosis not present

## 2020-07-24 DIAGNOSIS — I739 Peripheral vascular disease, unspecified: Secondary | ICD-10-CM | POA: Diagnosis not present

## 2020-07-27 ENCOUNTER — Other Ambulatory Visit: Payer: Self-pay | Admitting: Family Medicine

## 2020-07-27 DIAGNOSIS — I10 Essential (primary) hypertension: Secondary | ICD-10-CM

## 2020-07-27 DIAGNOSIS — I5033 Acute on chronic diastolic (congestive) heart failure: Secondary | ICD-10-CM

## 2020-08-01 ENCOUNTER — Ambulatory Visit (INDEPENDENT_AMBULATORY_CARE_PROVIDER_SITE_OTHER): Payer: BC Managed Care – PPO | Admitting: Family Medicine

## 2020-08-01 ENCOUNTER — Encounter: Payer: Self-pay | Admitting: Family Medicine

## 2020-08-01 ENCOUNTER — Other Ambulatory Visit: Payer: Self-pay

## 2020-08-01 VITALS — BP 130/80 | HR 85 | Temp 98.5°F | Wt 232.6 lb

## 2020-08-01 DIAGNOSIS — Z23 Encounter for immunization: Secondary | ICD-10-CM

## 2020-08-01 DIAGNOSIS — M79671 Pain in right foot: Secondary | ICD-10-CM | POA: Diagnosis not present

## 2020-08-01 DIAGNOSIS — L720 Epidermal cyst: Secondary | ICD-10-CM

## 2020-08-01 NOTE — Progress Notes (Signed)
Subjective:    Patient ID: Curtis Patton, male    DOB: [birth year], 54 y.o.   MRN: [id number]  No chief complaint on file.   HPI Patient was seen today for ongoing concerns.  Patient endorses right heel pain times several weeks.  Pain has started to improve after wearing heel inserts.  Patient endorses being on his feet at work.  Typically wears Nike tennis shoe.  Patient denies pain along dorsum of foot or pain when stepping out of bed in the morning.  Patient also notes bumps on back of head.  States they have been "present times a while".  Initially noted after going to the barber.  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  Patient notes the bumps often have malodorous discharge. Past Medical History:  Diagnosis Date  . (HFpEF) heart failure with preserved ejection fraction ([place])    a. Echo 08/10/2018: LVEF 50-55% w/ mild LVH, mild diffuse hypokinesis w/ no regional variation, and G1DD  . Acute CHF ([place])   . Acute renal insufficiency   . Acute respiratory failure with hypoxia ([place])   . Hypertension   . Migraine     Allergies  Allergen Reactions  . Contrast Media [Iodinated Diagnostic Agents] Hives    ROS General: Denies fever, chills, night sweats, changes in weight, changes in appetite HEENT: Denies headaches, ear pain, changes in vision, rhinorrhea, sore throat CV: Denies CP, palpitations, SOB, orthopnea Pulm: Denies SOB, cough, wheezing GI: Denies abdominal pain, nausea, vomiting, diarrhea, constipation GU: Denies dysuria, hematuria, frequency, vaginal discharge Msk: Denies muscle cramps, joint pains  + right heel pain Neuro: Denies weakness, numbness, tingling Skin: Denies rashes, bruising  + bumps on scalp Psych: Denies depression, anxiety, hallucinations    Objective:    Blood pressure 130/80, pulse 85, temperature 98.5 F (36.9 C), temperature source Oral, weight 232 lb 9.6 oz (105.5 kg), SpO2 97 %.   Gen. Pleasant, well-nourished, in no distress, normal  affect   HEENT: [place]/AT, face symmetric, conjunctiva clear, no scleral icterus, PERRLA, EOMI, nares patent without drainage Lungs: no accessory muscle use, CTAB, no wheezes or rales Cardiovascular: RRR, no m/r/g, no peripheral edema Musculoskeletal: No deformities, no cyanosis or clubbing, normal tone Neuro:  A&Ox3, CN II-XII intact, normal gait Skin:  Warm, dry, intact.  Scalp several depressions with dark debris In various stages of healing.  Occipital area with complaints of papules.  Provider able to express keratotic debris from 1 lesion.       Wt Readings from Last 3 Encounters:  08/01/20 232 lb 9.6 oz (105.5 kg)  03/15/20 229 lb 12.8 oz (104.2 kg)  01/27/20 221 lb (100.2 kg)    Lab Results  Component Value Date   WBC 13.3 (H) 01/04/2020   HGB 16.0 01/04/2020   HCT 48.0 01/04/2020   PLT 282 01/04/2020   GLUCOSE 77 01/04/2020   CHOL 203 (H) 06/22/2019   TRIG 124 06/22/2019   HDL 45 06/22/2019   LDLCALC 136 (H) 06/22/2019   ALT 26 06/22/2019   AST 18 06/22/2019   NA 139 01/04/2020   K 4.9 01/04/2020   CL 104 01/04/2020   CREATININE 0.94 01/04/2020   BUN 11 01/04/2020   CO2 23 01/04/2020   TSH 0.727 06/22/2019   PSA 0.39 10/26/2019   HGBA1C 5.3 06/22/2019    Assessment/Plan:  Epidermoid cyst of skin of scalp  -Given handout on cyst -Discussed I&D with removal of cyst sac.  Given numerous lesions will send patient to dermatology. -  Plan: Ambulatory referral to Dermatology  Pain of right heel -Discussed possible causes including heel spur, fat pad atrophy, plantar fasciitis though not along the entire length of foot. -Discussed supportive care including stretching. -Okay to continue wearing heel pad -Given handout  F/u as needed  Grier Mitts, MD

## 2020-08-01 NOTE — Addendum Note (Signed)
Addended by: Wyvonne Lenz on: 08/01/2020 05:24 PM   Modules accepted: Orders

## 2020-08-01 NOTE — Patient Instructions (Signed)
Heel Pad Atrophy  Heel pad atrophy is a cause of heel pain. Your heels take much of the impact when you stand, walk, or run. A fat pad under your heel bone protects the bone and cushions it. Over time, the fat pad can break down and lose elasticity and size (atrophy). This causes heel pain due to decreased cushioning in your heel. In many cases, the pain occurs in both heels. You may feel an aching or burning pain that gets worse while you are walking or standing. This pain may be worse for athletes who play sports on hard surfaces with high impact on their heels. What are the causes? This condition is caused by the gradual break down of the fat pad under the heel bone. What increases the risk? This condition is more likely to develop in people who:  Are age 14 or older.  Play a sport that involves jumping and landing on hard surfaces, such as in basketball.  Are runners, especially if they land on their heels first when they run.  Are overweight.  Have diabetes, rheumatoid arthritis, or blood vessel disease.  Get steroid injections in the heel area.  Have had trauma to the heel area, such as falling from a height. What are the signs or symptoms? The most common symptom of this condition is burning or aching pain in one or both heels. The pain or tenderness may be worse when:  Walking or standing, especially on hard surfaces or for long amounts of time.  Walking barefoot.  Wearing hard-soled shoes.  Resting at night.  Pressing on the center of the heel. How is this diagnosed? This condition is diagnosed based on your symptoms, medical history, and a physical exam. Your health care provider will check your heel pad and see if you have tenderness in the center of your heel. You may also have an ultrasound to confirm the diagnosis and measure the thickness of your fat pad. How is this treated? Treatment for this condition includes:  Lessening the amount of time spent walking,  standing, or running.  Avoiding activities that cause pain.  Taking an over-the-counter pain reliever or anti-inflammatory medicine.  Wearing shoes with thick heel cushioning or using a soft shoe insert (orthotic).  Wearing shoes at all times, even indoors.  Taping your heels to increase support.  Losing weight if you are overweight. Follow these instructions at home: Managing pain, stiffness, and swelling   If directed, put ice on the injured area. ? Put ice in a plastic bag. ? Place a towel between your heel and the bag. ? Leave the ice on for 20 minutes, 2-3 times a day.  Move your toes often to lessen stiffness and swelling.  Raise (elevate) the foot above the level of your heart while you are sitting or lying down. Activity  Return to your normal activities as told by your health care provider. Ask your health care provider what activities are safe for you.  Do exercises as told by your health care provider. General instructions  Take over-the-counter and prescription medicines only as told by your health care provider.  Wear orthotics and supportive shoes as told by your health care provider. Do not wear high heels.  Do not use any products that contain nicotine or tobacco, such as cigarettes, e-cigarettes, and chewing tobacco. If you need help quitting, ask your health care provider.  If you are overweight, work with your health care provider to reach a healthy weight.  Keep all  follow-up visits as told by your health care provider. This is important. How is this prevented?  Give your body time to rest between periods of activity.  Wear comfortable and supportive shoes during athletic activity.  Maintain a healthy weight.  Manage long-term (chronic) health conditions, such as diabetes, high blood pressure, or blood vessel disease. Contact a health care provider if:  Your symptoms do not improve or they get worse.  You have concerns about your orthotics and  shoes. Summary  Heel pad atrophy is a cause of heel pain.  This condition is caused by the gradual breakdown of the fat pad under the heel bone.  Treatment may include avoiding activities that cause pain, using a soft shoe insert (orthotic), and maintaining a healthy weight. This information is not intended to replace advice given to you by your health care provider. Make sure you discuss any questions you have with your health care provider. Document Revised: 10/11/2018 Document Reviewed: 10/11/2018 Elsevier Patient Education  2020 [place] [facility]rophy [facility] Ask your health care provider which exercises are safe for you. Do exercises exactly as told by your health care provider and adjust them as directed. It is normal to feel mild stretching, pulling, tightness, or discomfort as you do these exercises. Stop the exercise right away if you feel sudden pain or your pain gets worse. Do not begin these exercises until told by your health care provider. Stretching exercises These exercises improve the movement and flexibility of your calf muscles. These exercises may also help to relieve pain and stiffness. Standing gastroc stretch This exercise is also called a standing calf (gastroc) stretch. 1. Stand with your hands against a wall. 2. Extend your left / right leg behind you, and bend your front knee slightly. Your heels should be on the floor. 3. Keeping your heels on the floor and your back knee straight, shift your weight toward the wall. You should feel a gentle stretch in the back of your lower leg (calf). 4. Hold this position for __________ seconds. Repeat __________ times. Complete this exercise __________ times a day. Gastroc and soleus stretch, standing This is an exercise in which you stand on a step and use your body weight to stretch your calf muscles. To do this exercise: 1. Stand with the ball of your left / right foot on a step. The ball of your foot is on the  walking surface, right under your toes. 2. Keep your other foot firmly on the same step. 3. Hold on to the wall, a railing, or a chair for balance. 4. Slowly lift your other foot, allowing your body weight to press your left / right heel down over the edge of the step. You should feel a stretch in your left / right calf. 5. Hold this position for __________ seconds. 6. Return both feet to the step. 7. Repeat this exercise with a slight bend in your left / right knee. Repeat __________ times. Complete this exercise __________ times a day. Strengthening exercise This exercise builds strength and endurance in your foot muscles and may help to take pressure off your heel. Endurance is the ability to use your muscles for a long time, even after they get tired. Arch lifts This exercise is sometimes called foot intrinsics. This is an exercise in which you lift the arch part of your foot only. To do this exercise: 1. Sit in a chair with your feet flat on the floor. 2. Keeping your big toe  and your heel on the floor, lift only your arch, which is on the inner edge of your left / right foot. Do not move your knee or scrunch your toes. This is a small movement. 3. Hold this position for __________ seconds. 4. Return to the starting position. Repeat __________ times. Complete this exercise __________ times a day. This information is not intended to replace advice given to you by your health care provider. Make sure you discuss any questions you have with your health care provider. Document Revised: 01/27/2019 Document Reviewed: 07/20/2018 Elsevier Patient Education  [place].  Epidermal Cyst  An epidermal cyst is a sac made of skin tissue. The sac contains a substance called keratin. Keratin is a protein that is normally secreted through the hair follicles. When keratin becomes trapped in the top layer of skin (epidermis), it can form an epidermal cyst. Epidermal cysts can be found anywhere on  your body. These cysts are usually harmless (benign), and they may not cause symptoms unless they become infected. What are the causes? This condition may be caused by:  A blocked hair follicle.  A hair that curls and re-enters the skin instead of growing straight out of the skin (ingrown hair).  A blocked pore.  Irritated skin.  An injury to the skin.  Certain conditions that are passed along from parent to child (inherited).  Human papillomavirus (HPV).  Long-term (chronic) sun damage to the skin. What increases the risk? The following factors may make you more likely to develop an epidermal cyst:  Having acne.  Being overweight.  Being 17-13 years old. What are the signs or symptoms? The only symptom of this condition may be a small, painless lump underneath the skin. When an epidermal cyst ruptures, it may become infected. Symptoms may include:  Redness.  Inflammation.  Tenderness.  Warmth.  Fever.  Keratin draining from the cyst. Keratin is grayish-white, bad-smelling substance.  Pus draining from the cyst. How is this diagnosed? This condition is diagnosed with a physical exam.  In some cases, you may have a sample of tissue (biopsy) taken from your cyst to be examined under a microscope or tested for bacteria.  You may be referred to a health care provider who specializes in skin care (dermatologist). How is this treated? In many cases, epidermal cysts go away on their own without treatment. If a cyst becomes infected, treatment may include:  Opening and draining the cyst, done by a health care provider. After draining, minor surgery to remove the rest of the cyst may be done.  Antibiotic medicine.  Injections of medicines (steroids) that help to reduce inflammation.  Surgery to remove the cyst. Surgery may be done if the cyst: ? Becomes large. ? Bothers you. ? Has a chance of turning into cancer.  Do not try to open a cyst yourself. Follow these  instructions at home:  Take over-the-counter and prescription medicines only as told by your health care provider.  If you were prescribed an antibiotic medicine, take it it as told by your health care provider. Do not stop using the antibiotic even if you start to feel better.  Keep the area around your cyst clean and dry.  Wear loose, dry clothing.  Avoid touching your cyst.  Check your cyst every day for signs of infection. Check for: ? Redness, swelling, or pain. ? Fluid or blood. ? Warmth. ? Pus or a bad smell.  Keep all follow-up visits as told by your health care  provider. This is important. How is this prevented?  Wear clean, dry, clothing.  Avoid wearing tight clothing.  Keep your skin clean and dry. Take showers or baths every day. Contact a health care provider if:  Your cyst develops symptoms of infection.  Your condition is not improving or is getting worse.  You develop a cyst that looks different from other cysts you have had.  You have a fever. Get help right away if:  Redness spreads from the cyst into the surrounding area. Summary  An epidermal cyst is a sac made of skin tissue. These cysts are usually harmless (benign), and they may not cause symptoms unless they become infected.  If a cyst becomes infected, treatment may include surgery to open and drain the cyst, or to remove it. Treatment may also include medicines by mouth or through an injection.  Take over-the-counter and prescription medicines only as told by your health care provider. If you were prescribed an antibiotic medicine, take it as told by your health care provider. Do not stop using the antibiotic even if you start to feel better.  Contact a health care provider if your condition is not improving or is getting worse.  Keep all follow-up visits as told by your health care provider. This is important. This information is not intended to replace advice given to you by your health  care provider. Make sure you discuss any questions you have with your health care provider. Document Revised: 01/27/2019 Document Reviewed: 04/19/2018 Elsevier Patient Education  [place].

## 2020-09-16 NOTE — Progress Notes (Signed)
Cardiology Office Note:   Date:  09/18/2020  NAME:  Curtis Patton    MRN: [id number] DOB:  07-14-1966   PCP:  Billie Ruddy, MD  Cardiologist:  Evalina Field, MD   Referring MD: Billie Ruddy, MD   Chief Complaint  Patient presents with  . PAD   History of Present Illness:   Curtis Patton is a 54 y.o. male with a hx of PAD, HFpEF, HTN, HLD, tobacco abuse who presents for follow-up. Needs repeat lipid profile.  He reports he is doing well.  No symptoms of leg claudication.  Doing well since his peripheral intervention.  His blood pressure today is 132/92.  He is taking amlodipine, carvedilol and losartan.  Propranolol is listed on his medications but he reports he is not taking this.  We will remove this.  I did inform that he cannot take propranolol and Coreg.  He remains on Crestor 20 mg a day.  Does need a repeat lipid profile.  He is fasting we will obtain one today.  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  Denies any chest pain or shortness of breath with his current level of activity.  He is able to complete a full day's work without any major limitations.  Overall, he is doing well.  We should make sure his cholesterol levels in normal limits.  He is quit smoking and stay that way.  I have congratulated him on this.  Problem List 1. HFpEF  -EF 50-55% 2. HTN 3. HLD -T chol 203, HDL 45, LDL 136, TG 124 4. Tobacco Abuse 5. PAD -R SFA CTO s/p atherectomy/angioplasty  -90% mid L SFA (asymptomatic)  Past Medical History: Past Medical History:  Diagnosis Date  . (HFpEF) heart failure with preserved ejection fraction ([place])    a. Echo 08/10/2018: LVEF 50-55% w/ mild LVH, mild diffuse hypokinesis w/ no regional variation, and G1DD  . Acute CHF ([place])   . Acute renal insufficiency   . Acute respiratory failure with hypoxia ([place])   . Hypertension   . Migraine     Past Surgical History: Past Surgical History:  Procedure Laterality Date  . ABDOMINAL  AORTOGRAM W/LOWER EXTREMITY N/A 01/12/2020   Procedure: ABDOMINAL AORTOGRAM W/LOWER EXTREMITY;  Surgeon: Lorretta Harp, MD;  Location: [facility];  Service: Cardiovascular;  Laterality: N/A;  . JOINT REPLACEMENT    . neck    . PERIPHERAL VASCULAR ATHERECTOMY  01/12/2020   Procedure: PERIPHERAL VASCULAR ATHERECTOMY;  Surgeon: Lorretta Harp, MD;  Location: [facility];  Service: Cardiovascular;;  RT SFA w/DCB   . ROTATOR CUFF REPAIR    . SPINE SURGERY      Current Medications: Current Meds  Medication Sig  . amLODipine (NORVASC) 10 MG tablet Take 1 tablet by mouth once daily  . aspirin EC 81 MG EC tablet Take 1 tablet (81 mg total) by mouth daily.  . carvedilol (COREG) 25 MG tablet TAKE 1 TABLET BY MOUTH TWICE DAILY WITH MEALS  . clopidogrel (PLAVIX) 75 MG tablet Take 1 tablet (75 mg total) by mouth daily with breakfast.  . doxycycline (VIBRAMYCIN) 100 MG capsule Take 100 mg by mouth 2 (two) times daily.  Marland Kitchen losartan (COZAAR) 50 MG tablet Take 1 tablet by mouth once daily  . Menthol, Topical Analgesic, (ICY HOT EX) Apply 1 application topically 4 (four) times daily as needed (pain.).  Marland Kitchen propranolol (INDERAL) 10 MG tablet Take 10 mg by mouth 2 (  two) times daily.  . Saw Palmetto, Serenoa repens, (SAW PALMETTO PO) Take 2 tablets by mouth daily.  . sildenafil (VIAGRA) 25 MG tablet Take 1 tablet (25 mg total) by mouth daily as needed for erectile dysfunction.  . SUMAtriptan (IMITREX) 25 MG tablet TAKE 1 TABLET BY MOUTH EVERY 2 HOURS AS NEEDED FOR MIGRAINE. MAY REPEAT IN 2 HOURS IF HEADACHE PERSISTS OR RECURS. See primary doctor for future refills.  . [DISCONTINUED] sildenafil (VIAGRA) 25 MG tablet Take 1 tablet (25 mg total) by mouth daily as needed for erectile dysfunction.     Allergies:    Contrast media [iodinated diagnostic agents]   Social History: Social History   Socioeconomic History  . Marital status: Married    Spouse name: Not on file  . Number of  children: Not on file  . Years of education: Not on file  . Highest education level: Not on file  Occupational History  . Not on file  Tobacco Use  . Smoking status: Current Some Day Smoker    Packs/day: 1.00    Types: Cigars  . Smokeless tobacco: Never Used  Vaping Use  . Vaping Use: Never used  Substance and Sexual Activity  . Alcohol use: Yes    Alcohol/week: 6.0 standard drinks    Types: 4 Cans of beer, 2 Shots of liquor per week  . Drug use: No  . Sexual activity: Not Currently    Partners: Female  Other Topics Concern  . Not on file  Social History Narrative  . Not on file   Social Determinants of Health   Financial Resource Strain:   . Difficulty of Paying Living Expenses: Not on file  Food Insecurity:   . Worried About Charity fundraiser in the Last Year: Not on file  . Ran Out of Food in the Last Year: Not on file  Transportation Needs:   . Lack of Transportation (Medical): Not on file  . Lack of Transportation (Non-Medical): Not on file  Physical Activity:   . Days of Exercise per Week: Not on file  . Minutes of Exercise per Session: Not on file  Stress:   . Feeling of Stress : Not on file  Social Connections:   . Frequency of Communication with Friends and Family: Not on file  . Frequency of Social Gatherings with Friends and Family: Not on file  . Attends Religious Services: Not on file  . Active Member of Clubs or Organizations: Not on file  . Attends Archivist Meetings: Not on file  . Marital Status: Not on file     Family History: The patient's family history includes Hypertension in his maternal grandmother, mother, and another family member; Sudden death in his paternal grandmother. There is no history of Colon cancer, Colon polyps, Esophageal cancer, Rectal cancer, or Stomach cancer.  ROS:   All other ROS reviewed and negative. Pertinent positives noted in the HPI.     EKGs/Labs/Other Studies Reviewed:   The following studies were  personally reviewed by me today:  ABI 07/25/2020 Summary:  Right: Resting right ankle-brachial index is within normal range. No  evidence of significant right lower extremity arterial disease. The right  toe-brachial index is abnormal.   Left: Resting left ankle-brachial index is within normal range. No  evidence of significant left lower extremity arterial disease. The left  toe-brachial index is abnormal.    Recent Labs: 01/04/2020: BUN 11; Creatinine, Ser 0.94; Hemoglobin 16.0; Platelets 282; Potassium 4.9; Sodium 139  Recent Lipid Panel    Component Value Date/Time   CHOL 203 (H) 06/22/2019 1008   TRIG 124 06/22/2019 1008   HDL 45 06/22/2019 1008   CHOLHDL 4.5 06/22/2019 1008   LDLCALC 136 (H) 06/22/2019 1008    Physical Exam:   VS:  BP (!) 132/92   Pulse 79   Ht 6\' 2"  (1.88 m)   Wt 232 lb (105.2 kg)   SpO2 99%   BMI 29.79 kg/m    Wt Readings from Last 3 Encounters:  09/18/20 232 lb (105.2 kg)  08/01/20 232 lb 9.6 oz (105.5 kg)  03/15/20 229 lb 12.8 oz (104.2 kg)    General: Well nourished, well developed, in no acute distress Heart: Atraumatic, normal size  Eyes: PEERLA, EOMI  Neck: Supple, no JVD Endocrine: No thryomegaly Cardiac: Normal S1, S2; RRR; no murmurs, rubs, or gallops Lungs: Clear to auscultation bilaterally, no wheezing, rhonchi or rales  Abd: Soft, nontender, no hepatomegaly  Ext: No edema, pulses 2+ Musculoskeletal: No deformities, BUE and BLE strength normal and equal Skin: Warm and dry, no rashes   Neuro: Alert and oriented to person, place, time, and situation, CNII-XII grossly intact, no focal deficits  Psych: Normal mood and affect   ASSESSMENT:   Jasmeet Gehl is a 54 y.o. male who presents for the following: 1. PAD (peripheral artery disease) ([place])   2. Mixed hyperlipidemia   3. Essential hypertension   4. Chronic diastolic heart failure ([place])   5. Erectile dysfunction, unspecified erectile dysfunction type     PLAN:   1.  PAD (peripheral artery disease) ([place]) 2. Mixed hyperlipidemia -R SFA CTO s/p atherectomy/angioplasty  -90% mid L SFA (asymptomatic) -He reports he is doing well without major symptoms.  Continue to follow with Dr. Alvester Chou.  We do need repeat lipid profile.  I would like to get his LDL cholesterol less than 70.  He is fasting.  He will continue aspirin, Plavix at the discretion of Dr. Alvester Chou.  He also will continue Crestor 20 mg a day.   3. Essential hypertension 4. Chronic diastolic heart failure ([facility]) -Blood pressure acceptable today.  He will continue amlodipine 10 mg a day, Coreg 25 twice a day, losartan 50 mg daily.  Propranolol was on his list but I have asked him to not take this medication.  He should not be on two beta-blockers.  Overall he denies any major symptoms such as worsening volume overload.  Weights are stable.  5. Erectile dysfunction, unspecified erectile dysfunction type -Refill Viagra today.  Disposition: Return in about 1 year (around 09/18/2021).  Medication Adjustments/Labs and Tests Ordered: Current medicines are reviewed at length with the patient today.  Concerns regarding medicines are outlined above.  Orders Placed This Encounter  Procedures  . Lipid panel   Meds ordered this encounter  Medications  . sildenafil (VIAGRA) 25 MG tablet    Sig: Take 1 tablet (25 mg total) by mouth daily as needed for erectile dysfunction.    Dispense:  25 tablet    Refill:  1    Patient Instructions  Medication Instructions:  Continue same medications *If you need a refill on your cardiac medications before your next appointment, please call your pharmacy*   Lab Work: None ordered   Testing/Procedures: None ordered   Follow-Up: At [facility], you and your health needs are our priority.  As part of our continuing mission to provide you with exceptional heart care, we have created designated Provider Care Teams.  These Care Teams include your primary Cardiologist  (physician) and Advanced Practice Providers (APPs -  Physician Assistants and Nurse Practitioners) who all work together to provide you with the care you need, when you need it.  We recommend signing up for the patient portal called "MyChart".  Sign up information is provided on this After Visit Summary.  MyChart is used to connect with patients for Virtual Visits (Telemedicine).  Patients are able to view lab/test results, encounter notes, upcoming appointments, etc.  Non-urgent messages can be sent to your provider as well.   To learn more about what you can do with MyChart, go to [email or web address].    Your next appointment: 1 year   Call in July to schedule Nov appointment     The format for your next appointment: Office    Provider:  Dr.O'Neal      Time Spent with Patient: I have spent a total of 25 minutes with patient reviewing hospital notes, telemetry, EKGs, labs and examining the patient as well as establishing an assessment and plan that was discussed with the patient.  > 50% of time was spent in direct patient care.  Signed, Addison Naegeli. Audie Box, [place]  [address], [place], [place] [address] [phone]  09/18/2020 4:23 PM

## 2020-09-18 ENCOUNTER — Encounter: Payer: Self-pay | Admitting: Cardiovascular Disease

## 2020-09-18 ENCOUNTER — Ambulatory Visit (INDEPENDENT_AMBULATORY_CARE_PROVIDER_SITE_OTHER): Payer: BC Managed Care – PPO | Admitting: Cardiovascular Disease

## 2020-09-18 ENCOUNTER — Other Ambulatory Visit: Payer: Self-pay

## 2020-09-18 VITALS — BP 132/92 | HR 79 | Ht 74.0 in | Wt 232.0 lb

## 2020-09-18 DIAGNOSIS — I10 Essential (primary) hypertension: Secondary | ICD-10-CM | POA: Diagnosis not present

## 2020-09-18 DIAGNOSIS — I739 Peripheral vascular disease, unspecified: Secondary | ICD-10-CM

## 2020-09-18 DIAGNOSIS — I5032 Chronic diastolic (congestive) heart failure: Secondary | ICD-10-CM | POA: Diagnosis not present

## 2020-09-18 DIAGNOSIS — E782 Mixed hyperlipidemia: Secondary | ICD-10-CM

## 2020-09-18 DIAGNOSIS — N529 Male erectile dysfunction, unspecified: Secondary | ICD-10-CM

## 2020-09-18 MED ORDER — SILDENAFIL CITRATE 25 MG PO TABS: 25.0000 mg | ORAL_TABLET | Freq: Every day | ORAL | 1 refills | Status: DC | PRN

## 2020-09-18 NOTE — Patient Instructions (Signed)
Medication Instructions:  Continue same medications *If you need a refill on your cardiac medications before your next appointment, please call your pharmacy*   Lab Work: None ordered   Testing/Procedures: None ordered   Follow-Up: At [facility], you and your health needs are our priority.  As part of our continuing mission to provide you with exceptional heart care, we have created designated Provider Care Teams.  These Care Teams include your primary Cardiologist (physician) and Advanced Practice Providers (APPs -  Physician Assistants and Nurse Practitioners) who all work together to provide you with the care you need, when you need it.  We recommend signing up for the patient portal called "MyChart".  Sign up information is provided on this After Visit Summary.  MyChart is used to connect with patients for Virtual Visits (Telemedicine).  Patients are able to view lab/test results, encounter notes, upcoming appointments, etc.  Non-urgent messages can be sent to your provider as well.   To learn more about what you can do with MyChart, go to [email or web address].    Your next appointment: 1 year   Call in July to schedule Nov appointment     The format for your next appointment: Office    Provider:  Dr.O'Neal

## 2020-09-19 LAB — LIPID PANEL
Chol/HDL Ratio: 2.8 ratio (ref 0.0–5.0)
Cholesterol, Total: 141 mg/dL (ref 100–199)
HDL: 51 mg/dL (ref >39)
LDL Chol Calc (NIH): 64 mg/dL (ref 0–99)
Triglycerides: 149 mg/dL (ref 0–149)
VLDL Cholesterol Cal: 26 mg/dL (ref 5–40)

## 2020-10-25 ENCOUNTER — Other Ambulatory Visit: Payer: Self-pay | Admitting: Family Medicine

## 2020-10-25 DIAGNOSIS — I10 Essential (primary) hypertension: Secondary | ICD-10-CM

## 2020-10-25 DIAGNOSIS — I5033 Acute on chronic diastolic (congestive) heart failure: Secondary | ICD-10-CM

## 2021-01-24 ENCOUNTER — Other Ambulatory Visit: Payer: Self-pay | Admitting: Family Medicine

## 2021-01-24 DIAGNOSIS — I5033 Acute on chronic diastolic (congestive) heart failure: Secondary | ICD-10-CM

## 2021-01-24 DIAGNOSIS — I10 Essential (primary) hypertension: Secondary | ICD-10-CM

## 2021-02-06 ENCOUNTER — Other Ambulatory Visit: Payer: Self-pay | Admitting: Cardiovascular Disease

## 2021-02-06 DIAGNOSIS — I10 Essential (primary) hypertension: Secondary | ICD-10-CM

## 2021-02-06 DIAGNOSIS — I5033 Acute on chronic diastolic (congestive) heart failure: Secondary | ICD-10-CM

## 2021-03-08 ENCOUNTER — Ambulatory Visit (INDEPENDENT_AMBULATORY_CARE_PROVIDER_SITE_OTHER): Payer: BC Managed Care – PPO | Admitting: Cardiovascular Disease

## 2021-03-08 ENCOUNTER — Encounter: Payer: Self-pay | Admitting: Cardiovascular Disease

## 2021-03-08 ENCOUNTER — Other Ambulatory Visit: Payer: Self-pay

## 2021-03-08 VITALS — BP 135/81 | HR 81 | Ht 74.0 in | Wt 226.4 lb

## 2021-03-08 DIAGNOSIS — I739 Peripheral vascular disease, unspecified: Secondary | ICD-10-CM

## 2021-03-08 DIAGNOSIS — E782 Mixed hyperlipidemia: Secondary | ICD-10-CM

## 2021-03-08 DIAGNOSIS — I10 Essential (primary) hypertension: Secondary | ICD-10-CM | POA: Diagnosis not present

## 2021-03-08 NOTE — Progress Notes (Signed)
03/08/2021 Curtis Patton   Dec 03, 1965  [id number]  Primary Physician Billie Ruddy, MD Primary Cardiologist: Lorretta Harp MD [name], [name]  HPI:  Curtis Patton is a 55 y.o.  mild to moderately overweight separated African-American male father of 60 children, grandfather of 3 grandchildren who works for [employer]. Is referred to me by Dr. Audie Box, his cardiologist, for peripheral vascular evaluation and treatment because of right calf lifestyle limiting claudication.  I last saw him in the office 01/27/2020.He does have a history of treated hypertension hyperlipidemia. He denies chest pain or shortness of breath. Is never had a heart attack or stroke. Does have a history of tobacco abuse as well as heart failure with preserved EF. He has noticed right calf claudication over the last several months which is lifestyle limiting. Dopplers performed 12/28/2019 revealed a right ankle-brachial index of 0.74 and a left of 1.02 with a short segment occlusion mid right SFA.  I performed angiography on him 01/12/2020 revealing a mid right SFA CTO with two-vessel runoff.  He had a focal 90% mid left SFA stenosis with two-vessel runoff although he is asymptomatic on that side.  I performed [facility] 1 directional atherectomy followed by drug-coated balloon angioplasty.  His claudication is resolved and his Dopplers are normalized.  Of note, he did stop smoking at that time.  Since I saw him in the office a year ago he continues to do well.  He still denies claudication.  His most recent Doppler studies from 07/24/2020 revealed normal ABIs bilaterally with a widely patent right SFA.  He continues to deny claudication on the left. Current Meds  Medication Sig  . amLODipine (NORVASC) 10 MG tablet Take 1 tablet by mouth once daily  . aspirin EC 81 MG EC tablet Take 1 tablet (81 mg total) by mouth daily.  . carvedilol (COREG) 25 MG tablet TAKE 1 TABLET BY MOUTH TWICE DAILY  WITH MEALS  . clopidogrel (PLAVIX) 75 MG tablet Take 1 tablet (75 mg total) by mouth daily with breakfast.  . doxycycline (VIBRAMYCIN) 100 MG capsule Take 100 mg by mouth 2 (two) times daily.  Marland Kitchen losartan (COZAAR) 50 MG tablet Take 1 tablet by mouth once daily  . Menthol, Topical Analgesic, (ICY HOT EX) Apply 1 application topically 4 (four) times daily as needed (pain.).  Marland Kitchen propranolol (INDERAL) 10 MG tablet Take 10 mg by mouth 2 (two) times daily.  . Saw Palmetto, Serenoa repens, (SAW PALMETTO PO) Take 2 tablets by mouth daily.  . sildenafil (VIAGRA) 25 MG tablet Take 1 tablet (25 mg total) by mouth daily as needed for erectile dysfunction.  . SUMAtriptan (IMITREX) 25 MG tablet TAKE 1 TABLET BY MOUTH EVERY 2 HOURS AS NEEDED FOR MIGRAINE. MAY REPEAT IN 2 HOURS IF HEADACHE PERSISTS OR RECURS. See primary doctor for future refills.     Allergies  Allergen Reactions  . Contrast Media [Iodinated Diagnostic Agents] Hives    Social History   Socioeconomic History  . Marital status: Married    Spouse name: Not on file  . Number of children: Not on file  . Years of education: Not on file  . Highest education level: Not on file  Occupational History  . Not on file  Tobacco Use  . Smoking status: Current Some Day Smoker    Packs/day: 1.00    Types: Cigars  . Smokeless tobacco: Never Used  Vaping Use  . Vaping Use: Never used  Substance  and Sexual Activity  . Alcohol use: Yes    Alcohol/week: 6.0 standard drinks    Types: 4 Cans of beer, 2 Shots of liquor per week  . Drug use: No  . Sexual activity: Not Currently    Partners: Female  Other Topics Concern  . Not on file  Social History Narrative  . Not on file   Social Determinants of Health   Financial Resource Strain: Not on file  Food Insecurity: Not on file  Transportation Needs: Not on file  Physical Activity: Not on file  Stress: Not on file  Social Connections: Not on file  Intimate Partner Violence: Not on file      Review of Systems: General: negative for chills, fever, night sweats or weight changes.  Cardiovascular: negative for chest pain, dyspnea on exertion, edema, orthopnea, palpitations, paroxysmal nocturnal dyspnea or shortness of breath Dermatological: negative for rash Respiratory: negative for cough or wheezing Urologic: negative for hematuria Abdominal: negative for nausea, vomiting, diarrhea, bright red blood per rectum, melena, or hematemesis Neurologic: negative for visual changes, syncope, or dizziness All other systems reviewed and are otherwise negative except as noted above.    Blood pressure 135/81, pulse 81, height 6\' 2"  (1.88 m), weight 226 lb 6.4 oz (102.7 kg), SpO2 96 %.  General appearance: alert and no distress Neck: no adenopathy, no carotid bruit, no JVD, supple, symmetrical, trachea midline and thyroid not enlarged, symmetric, no tenderness/mass/nodules Lungs: clear to auscultation bilaterally Heart: regular rate and rhythm, S1, S2 normal, no murmur, click, rub or gallop Extremities: extremities normal, atraumatic, no cyanosis or edema Pulses: 2+ and symmetric Skin: Skin color, texture, turgor normal. No rashes or lesions Neurologic: Alert and oriented X 3, normal strength and tone. Normal symmetric reflexes. Normal coordination and gait  EKG sinus rhythm at 81 with left axis deviation and nonspecific ST and T wave changes.  Personally reviewed this EKG.  ASSESSMENT AND PLAN:   Peripheral arterial disease ([place]) History of PAD status post peripheral angiography by myself performed 01/12/2020 with a long segment CTO mid right SFA with two-vessel runoff.  I performed directional atherectomy followed by drug-coated balloon angioplasty with excellent result.  His Dopplers improved and his claudication resolved.  He did have a focal 90% mid left SFA stenosis with two-vessel runoff.  He is completely asymptomatic on that side.  We will repeat lower extremity arterial Doppler  studies as October and I will see him back as needed.      Lorretta Harp MD [place], [facility] 03/08/2021 3:38 PM

## 2021-03-08 NOTE — Patient Instructions (Addendum)
Medication Instructions:  Your physician recommends that you continue on your current medications as directed. Please refer to the Current Medication list given to you today.  *If you need a refill on your cardiac medications before your next appointment, please call your pharmacy*   Testing/Procedures: Your physician has requested that you have a lower extremity arterial duplex. This test is an ultrasound of the arteries in the legs. It looks at arterial blood flow in the legs. Allow one hour for Lower Arterial scans. There are no restrictions or special instructions  Your physician has requested that you have an ankle brachial index (ABI). During this test an ultrasound and blood pressure cuff are used to evaluate the arteries that supply the arms and legs with blood. Allow thirty minutes for this exam. There are no restrictions or special instructions.  These procedures are done at [facility]. 2nd Floor. To be done in October 2022.   Follow-Up: At [facility], you and your health needs are our priority.  As part of our continuing mission to provide you with exceptional heart care, we have created designated Provider Care Teams.  These Care Teams include your primary Cardiologist (physician) and Advanced Practice Providers (APPs -  Physician Assistants and Nurse Practitioners) who all work together to provide you with the care you need, when you need it.  We recommend signing up for the patient portal called "MyChart".  Sign up information is provided on this After Visit Summary.  MyChart is used to connect with patients for Virtual Visits (Telemedicine).  Patients are able to view lab/test results, encounter notes, upcoming appointments, etc.  Non-urgent messages can be sent to your provider as well.   To learn more about what you can do with MyChart, go to [email or web address].    Your next appointment:   No future appointments made at this time. We will see you on an as needed  basis.  Provider:   Quay Burow, MD

## 2021-03-08 NOTE — Assessment & Plan Note (Signed)
History of PAD status post peripheral angiography by myself performed 01/12/2020 with a long segment CTO mid right SFA with two-vessel runoff.  I performed directional atherectomy followed by drug-coated balloon angioplasty with excellent result.  His Dopplers improved and his claudication resolved.  He did have a focal 90% mid left SFA stenosis with two-vessel runoff.  He is completely asymptomatic on that side.  We will repeat lower extremity arterial Doppler studies as October and I will see him back as needed.

## 2021-04-27 ENCOUNTER — Other Ambulatory Visit: Payer: Self-pay | Admitting: Cardiovascular Disease

## 2021-04-27 ENCOUNTER — Other Ambulatory Visit: Payer: Self-pay | Admitting: Family Medicine

## 2021-04-27 DIAGNOSIS — I10 Essential (primary) hypertension: Secondary | ICD-10-CM

## 2021-04-27 DIAGNOSIS — I5033 Acute on chronic diastolic (congestive) heart failure: Secondary | ICD-10-CM

## 2021-05-21 ENCOUNTER — Other Ambulatory Visit: Payer: Self-pay | Admitting: Cardiovascular Disease

## 2021-05-21 DIAGNOSIS — I5033 Acute on chronic diastolic (congestive) heart failure: Secondary | ICD-10-CM

## 2021-05-21 DIAGNOSIS — I10 Essential (primary) hypertension: Secondary | ICD-10-CM

## 2021-07-04 ENCOUNTER — Other Ambulatory Visit: Payer: Self-pay | Admitting: Cardiovascular Disease

## 2021-07-10 ENCOUNTER — Other Ambulatory Visit: Payer: Self-pay | Admitting: Cardiovascular Disease

## 2021-07-10 DIAGNOSIS — I5033 Acute on chronic diastolic (congestive) heart failure: Secondary | ICD-10-CM

## 2021-07-10 DIAGNOSIS — I10 Essential (primary) hypertension: Secondary | ICD-10-CM

## 2021-07-16 ENCOUNTER — Encounter (HOSPITAL_COMMUNITY): Payer: Self-pay

## 2021-07-23 ENCOUNTER — Other Ambulatory Visit: Payer: Self-pay

## 2021-07-23 DIAGNOSIS — I739 Peripheral vascular disease, unspecified: Secondary | ICD-10-CM

## 2021-07-25 ENCOUNTER — Other Ambulatory Visit: Payer: Self-pay | Admitting: Family Medicine

## 2021-07-25 ENCOUNTER — Other Ambulatory Visit: Payer: Self-pay | Admitting: Cardiovascular Disease

## 2021-07-25 DIAGNOSIS — I10 Essential (primary) hypertension: Secondary | ICD-10-CM

## 2021-07-25 DIAGNOSIS — I5033 Acute on chronic diastolic (congestive) heart failure: Secondary | ICD-10-CM

## 2021-08-14 ENCOUNTER — Other Ambulatory Visit: Payer: Self-pay

## 2021-08-14 ENCOUNTER — Ambulatory Visit (HOSPITAL_COMMUNITY)
Admission: RE | Admit: 2021-08-14 | Discharge: 2021-08-14 | Disposition: A | Discharge status: Home / Self Care | Payer: BC Managed Care – PPO | Source: Ambulatory Visit | Attending: Cardiology | Admitting: Cardiology

## 2021-08-14 DIAGNOSIS — I739 Peripheral vascular disease, unspecified: Secondary | ICD-10-CM | POA: Diagnosis not present

## 2021-09-03 ENCOUNTER — Other Ambulatory Visit: Payer: Self-pay | Admitting: Family Medicine

## 2021-09-03 DIAGNOSIS — I10 Essential (primary) hypertension: Secondary | ICD-10-CM

## 2021-09-03 DIAGNOSIS — I5033 Acute on chronic diastolic (congestive) heart failure: Secondary | ICD-10-CM

## 2021-09-06 NOTE — Telephone Encounter (Signed)
Pt needs appt for CPE.

## 2021-09-17 ENCOUNTER — Other Ambulatory Visit (HOSPITAL_COMMUNITY): Payer: Self-pay | Admitting: Cardiovascular Disease

## 2021-09-17 DIAGNOSIS — I739 Peripheral vascular disease, unspecified: Secondary | ICD-10-CM

## 2021-09-17 DIAGNOSIS — Z9862 Peripheral vascular angioplasty status: Secondary | ICD-10-CM

## 2021-10-08 ENCOUNTER — Other Ambulatory Visit: Payer: Self-pay | Admitting: Family Medicine

## 2021-10-08 DIAGNOSIS — I10 Essential (primary) hypertension: Secondary | ICD-10-CM

## 2021-10-08 DIAGNOSIS — I5033 Acute on chronic diastolic (congestive) heart failure: Secondary | ICD-10-CM

## 2021-10-20 ENCOUNTER — Other Ambulatory Visit: Payer: Self-pay | Admitting: Family Medicine

## 2021-10-20 DIAGNOSIS — I5033 Acute on chronic diastolic (congestive) heart failure: Secondary | ICD-10-CM

## 2021-10-20 DIAGNOSIS — I10 Essential (primary) hypertension: Secondary | ICD-10-CM

## 2021-11-06 ENCOUNTER — Other Ambulatory Visit: Payer: Self-pay | Admitting: Family Medicine

## 2021-11-06 DIAGNOSIS — I10 Essential (primary) hypertension: Secondary | ICD-10-CM

## 2021-11-06 DIAGNOSIS — I5033 Acute on chronic diastolic (congestive) heart failure: Secondary | ICD-10-CM

## 2021-11-11 ENCOUNTER — Telehealth: Payer: Self-pay | Admitting: Family Medicine

## 2021-11-11 DIAGNOSIS — I10 Essential (primary) hypertension: Secondary | ICD-10-CM

## 2021-11-11 DIAGNOSIS — I5033 Acute on chronic diastolic (congestive) heart failure: Secondary | ICD-10-CM

## 2021-11-11 MED ORDER — AMLODIPINE BESYLATE 10 MG PO TABS: ORAL_TABLET | ORAL | 0 refills | Status: DC

## 2021-11-11 NOTE — Telephone Encounter (Signed)
Rx sent 

## 2021-11-11 NOTE — Telephone Encounter (Signed)
Patient is requesting a medication refill for his blood pressure medication until his appointment on Friday 01/27.  Patient could be contacted at [phone].  Please advise.

## 2021-11-15 ENCOUNTER — Ambulatory Visit: Payer: BC Managed Care – PPO | Admitting: Family Medicine

## 2021-11-22 ENCOUNTER — Encounter: Payer: Self-pay | Admitting: Family Medicine

## 2021-11-22 ENCOUNTER — Ambulatory Visit: Payer: BC Managed Care – PPO | Admitting: Family Medicine

## 2021-11-22 VITALS — BP 122/74 | HR 75 | Temp 98.0°F | Ht 74.0 in | Wt 232.8 lb

## 2021-11-22 DIAGNOSIS — N529 Male erectile dysfunction, unspecified: Secondary | ICD-10-CM

## 2021-11-22 DIAGNOSIS — Z125 Encounter for screening for malignant neoplasm of prostate: Secondary | ICD-10-CM

## 2021-11-22 DIAGNOSIS — I5032 Chronic diastolic (congestive) heart failure: Secondary | ICD-10-CM | POA: Diagnosis not present

## 2021-11-22 DIAGNOSIS — I10 Essential (primary) hypertension: Secondary | ICD-10-CM | POA: Diagnosis not present

## 2021-11-22 DIAGNOSIS — Z8669 Personal history of other diseases of the nervous system and sense organs: Secondary | ICD-10-CM

## 2021-11-22 DIAGNOSIS — I739 Peripheral vascular disease, unspecified: Secondary | ICD-10-CM

## 2021-11-22 LAB — LIPID PANEL
Cholesterol: 209 mg/dL — ABNORMAL HIGH (ref 0–200)
HDL: 50.4 mg/dL (ref >39.00)
LDL Cholesterol: 137 mg/dL — ABNORMAL HIGH (ref 0–99)
NonHDL: 158.31
Total CHOL/HDL Ratio: 4
Triglycerides: 105 mg/dL (ref 0.0–149.0)
VLDL: 21 mg/dL (ref 0.0–40.0)

## 2021-11-22 LAB — COMPREHENSIVE METABOLIC PANEL
ALT: 19 U/L (ref 0–53)
AST: 15 U/L (ref 0–37)
Albumin: 4.2 g/dL (ref 3.5–5.2)
Alkaline Phosphatase: 63 U/L (ref 39–117)
BUN: 17 mg/dL (ref 6–23)
CO2: 28 mEq/L (ref 19–32)
Calcium: 9.5 mg/dL (ref 8.4–10.5)
Chloride: 105 mEq/L (ref 96–112)
Creatinine, Ser: 0.99 mg/dL (ref 0.40–1.50)
GFR: 85.42 mL/min (ref >60.00)
Glucose, Bld: 96 mg/dL (ref 70–99)
Potassium: 4.5 mEq/L (ref 3.5–5.1)
Sodium: 138 mEq/L (ref 135–145)
Total Bilirubin: 0.6 mg/dL (ref 0.2–1.2)
Total Protein: 7.1 g/dL (ref 6.0–8.3)

## 2021-11-22 LAB — CBC WITH DIFFERENTIAL/PLATELET
Basophils Absolute: 0 10*3/uL (ref 0.0–0.1)
Basophils Relative: 0.2 % (ref 0.0–3.0)
Eosinophils Absolute: 0 10*3/uL (ref 0.0–0.7)
Eosinophils Relative: 0.1 % (ref 0.0–5.0)
HCT: 47.3 % (ref 39.0–52.0)
Hemoglobin: 16 g/dL (ref 13.0–17.0)
Lymphocytes Relative: 20 % (ref 12.0–46.0)
Lymphs Abs: 1.8 10*3/uL (ref 0.7–4.0)
MCHC: 33.9 g/dL (ref 30.0–36.0)
MCV: 81.7 fl (ref 78.0–100.0)
Monocytes Absolute: 0.8 10*3/uL (ref 0.1–1.0)
Monocytes Relative: 8.3 % (ref 3.0–12.0)
Neutro Abs: 6.6 10*3/uL (ref 1.4–7.7)
Neutrophils Relative %: 71.4 % (ref 43.0–77.0)
Platelets: 229 10*3/uL (ref 150.0–400.0)
RBC: 5.79 Mil/uL (ref 4.22–5.81)
RDW: 14.3 % (ref 11.5–15.5)
WBC: 9.2 10*3/uL (ref 4.0–10.5)

## 2021-11-22 LAB — PSA: PSA: 0.36 ng/mL (ref 0.10–4.00)

## 2021-11-22 MED ORDER — SUMATRIPTAN SUCCINATE 25 MG PO TABS: ORAL_TABLET | ORAL | 2 refills | Status: DC

## 2021-11-22 MED ORDER — SILDENAFIL CITRATE 25 MG PO TABS: 25.0000 mg | ORAL_TABLET | Freq: Every day | ORAL | 0 refills | Status: DC | PRN

## 2021-11-22 NOTE — Patient Instructions (Addendum)
It appears that you have several refills left on carvedilol (Coreg) 25 mg twice a day.  Contact your pharmacy so they can refill this medication for you.  Your other prescriptions were sent in for you.  You can take an 81 mg aspirin daily.

## 2021-11-22 NOTE — Progress Notes (Signed)
Subjective:    Patient ID: Curtis Patton, male    DOB: 03/18/1966, 56 y.o.   MRN: [id number]  Chief Complaint  Patient presents with   Medication Refill    HPI Patient was seen today for med refill and f/u.  Patient requesting refills on carvedilol, Viagra, Imitrex.  Patient followed by cardiology.  States blood pressure at home no higher than 138/80s.  Patient states lately he has not been eating as he should.  Had pizza last night for dinner and for breakfast this morning.  Patient has not had a migraine in a while.  Inquires if he should be taking Plavix and aspirin still.  Has history of PVD with stent in place.  Denies CP, blurred vision, LE edema, SOB.  Past Medical History:  Diagnosis Date   (HFpEF) heart failure with preserved ejection fraction ([place])    a. Echo 08/10/2018: LVEF 50-55% w/ mild LVH, mild diffuse hypokinesis w/ no regional variation, and G1DD   Acute CHF ([place])    Acute renal insufficiency    Acute respiratory failure with hypoxia (HCC)    Hypertension    Migraine     Allergies  Allergen Reactions   Contrast Media [Iodinated Contrast Media] Hives    ROS General: Denies fever, chills, night sweats, changes in weight, changes in appetite HEENT: Denies headaches, ear pain, changes in vision, rhinorrhea, sore throat CV: Denies CP, palpitations, SOB, orthopnea Pulm: Denies SOB, cough, wheezing GI: Denies abdominal pain, nausea, vomiting, diarrhea, constipation GU: Denies dysuria, hematuria, frequency Msk: Denies muscle cramps, joint pains Neuro: Denies weakness, numbness, tingling Skin: Denies rashes, bruising Psych: Denies depression, anxiety, hallucinations     Objective:    Blood pressure 122/74, pulse 75, temperature 98 F (36.7 C), temperature source Oral, height 6\' 2"  (1.88 m), weight 232 lb 12.8 oz (105.6 kg), SpO2 99 %.  Gen. Pleasant, well-nourished, in no distress, normal affect  HEENT: [place]/AT, face symmetric, conjunctiva clear, no scleral  icterus, PERRLA, EOMI, nares patent without drainage Neck: No JVD, no thyromegaly, no carotid bruits Lungs: no accessory muscle use, CTAB, no wheezes or rales Cardiovascular: RRR, no m/r/g, no peripheral edema Musculoskeletal: No deformities, no cyanosis or clubbing, normal tone Neuro:  A&Ox3, CN II-XII intact, normal gait Skin:  Warm, no lesions/ rash   Wt Readings from Last 3 Encounters:  03/08/21 226 lb 6.4 oz (102.7 kg)  09/18/20 232 lb (105.2 kg)  08/01/20 232 lb 9.6 oz (105.5 kg)    Lab Results  Component Value Date   WBC 13.3 (H) 01/04/2020   HGB 16.0 01/04/2020   HCT 48.0 01/04/2020   PLT 282 01/04/2020   GLUCOSE 77 01/04/2020   CHOL 141 09/18/2020   TRIG 149 09/18/2020   HDL 51 09/18/2020   LDLCALC 64 09/18/2020   ALT 26 06/22/2019   AST 18 06/22/2019   NA 139 01/04/2020   K 4.9 01/04/2020   CL 104 01/04/2020   CREATININE 0.94 01/04/2020   BUN 11 01/04/2020   CO2 23 01/04/2020   TSH 0.727 06/22/2019   PSA 0.39 10/26/2019   HGBA1C 5.3 06/22/2019    Assessment/Plan:  Essential hypertension  -Controlled -Continue current medications including Coreg 25 mg twice daily, Norvasc daily, and losartan 50 mg daily and lifestyle modifications encouraged - Plan: CMP  Screening for prostate cancer  - Plan: PSA  Chronic heart failure with preserved ejection fraction (HCC) -Stable -ECHO on 08/10/2018 with EF 16-96%, grade 1 diastolic dysfunction -Continue current medications -Continue follow-up with cardiology  Peripheral arterial disease (HCC) -Lifestyle modifications -Discussed continuing aspirin 81 mg daily -Continue follow-up with cardiology - Plan: Lipid Panel  Erectile dysfunction, unspecified erectile dysfunction type  -Stable  -Continue Viagra 25 mg - Plan: CBC with Differential/Platelet, sildenafil (VIAGRA) 25 MG tablet  History of migraine  -Stable -Continue preventive measures including hydration, rest, limiting caffeine intake, and using NSAID  sparingly - Plan: CBC with Differential/Platelet, SUMAtriptan (IMITREX) 25 MG tablet  F/u as needed in 4-6 months, sooner if needed  Grier Mitts, MD

## 2021-11-26 ENCOUNTER — Other Ambulatory Visit: Payer: Self-pay

## 2021-11-26 MED ORDER — ROSUVASTATIN CALCIUM 20 MG PO TABS: 20.0000 mg | ORAL_TABLET | Freq: Every day | ORAL | 3 refills | Status: DC

## 2021-12-08 ENCOUNTER — Other Ambulatory Visit: Payer: Self-pay | Admitting: Family Medicine

## 2021-12-08 DIAGNOSIS — I10 Essential (primary) hypertension: Secondary | ICD-10-CM

## 2021-12-08 DIAGNOSIS — I5033 Acute on chronic diastolic (congestive) heart failure: Secondary | ICD-10-CM

## 2022-03-05 ENCOUNTER — Other Ambulatory Visit: Payer: Self-pay | Admitting: Family Medicine

## 2022-03-05 DIAGNOSIS — I5033 Acute on chronic diastolic (congestive) heart failure: Secondary | ICD-10-CM

## 2022-03-05 DIAGNOSIS — I10 Essential (primary) hypertension: Secondary | ICD-10-CM

## 2022-08-29 ENCOUNTER — Ambulatory Visit (HOSPITAL_COMMUNITY)
Admission: RE | Admit: 2022-08-29 | Discharge: 2022-08-29 | Disposition: A | Discharge status: Home / Self Care | Payer: BC Managed Care – PPO | Source: Ambulatory Visit | Attending: Cardiovascular Disease | Admitting: Cardiovascular Disease

## 2022-08-29 DIAGNOSIS — I739 Peripheral vascular disease, unspecified: Secondary | ICD-10-CM | POA: Diagnosis not present

## 2022-08-29 DIAGNOSIS — Z9862 Peripheral vascular angioplasty status: Secondary | ICD-10-CM | POA: Insufficient documentation

## 2022-09-10 ENCOUNTER — Other Ambulatory Visit: Payer: Self-pay | Admitting: Family Medicine

## 2022-09-10 DIAGNOSIS — I10 Essential (primary) hypertension: Secondary | ICD-10-CM

## 2022-09-10 DIAGNOSIS — I5033 Acute on chronic diastolic (congestive) heart failure: Secondary | ICD-10-CM

## 2022-10-15 ENCOUNTER — Encounter: Payer: Self-pay | Admitting: Cardiovascular Disease

## 2022-10-15 ENCOUNTER — Ambulatory Visit: Payer: BC Managed Care – PPO | Attending: Cardiovascular Disease | Admitting: Cardiovascular Disease

## 2022-10-15 VITALS — BP 142/84 | HR 84 | Ht 74.0 in | Wt 207.2 lb

## 2022-10-15 DIAGNOSIS — I739 Peripheral vascular disease, unspecified: Secondary | ICD-10-CM

## 2022-10-15 MED ORDER — CILOSTAZOL 50 MG PO TABS: 50.0000 mg | ORAL_TABLET | Freq: Two times a day (BID) | ORAL | 2 refills | Status: DC

## 2022-10-15 NOTE — Assessment & Plan Note (Signed)
History of peripheral arterial disease status post right SFA directional atherectomy followed by drug coated balloon angioplasty 01/12/2020 for a short segment mid right SFA CTO with two-vessel runoff.  His claudication resolved after that and is Dopplers normalized.  He did have a 90% mid left SFA stenosis with two-vessel runoff but he was asymptomatic on that side.  His most recent Doppler studies performed 08/29/2022 revealed a decline in his right ABI down to 0.80.  His left ABI remains normal.  He does have a recurrent occlusion of his mid right SFA with mild claudication.  At this point, his symptoms are not as severe as they were prior to his initial intervention.  We decided to try empiric Pletal 50 mg p.o. twice daily to see whether this affords him significant improvement.  Will reassess in 3 months.  At that time, we will discuss whether or not we should proceed with reintervention.

## 2022-10-15 NOTE — Progress Notes (Signed)
10/15/2022 Curtis Patton   [birth year]  [id number]  Primary Physician Billie Ruddy, MD Primary Cardiologist: Lorretta Harp MD [name], [name]  HPI:  Curtis Patton is a 56 y.o.  mild to moderately overweight separated African-American male father of 78 children, grandfather of 3 grandchildren who works for [employer].  He was referred to me by Dr. Audie Box , his cardiologist, for peripheral vascular evaluation and treatment because of right calf lifestyle limiting claudication.  I last saw him in the office 03/08/2021. He does have a history of treated hypertension hyperlipidemia.  He denies chest pain or shortness of breath.  Is never had a heart attack or stroke.  Does have a history of tobacco abuse as well as heart failure with preserved EF.  He has noticed right calf claudication over the last several months which is lifestyle limiting.  Dopplers performed 12/28/2019 revealed a right ankle-brachial index of 0.74 and a left of 1.02 with a short segment occlusion mid right SFA.   I performed angiography on him 01/12/2020 revealing a mid right SFA CTO with two-vessel runoff.  He had a focal 90% mid left SFA stenosis with two-vessel runoff although he is asymptomatic on that side.  I performed [facility] 1 directional atherectomy followed by drug-coated balloon angioplasty.  His claudication is resolved and his Dopplers are normalized.  Of note, he did stop smoking at that time.   Since I saw him in the office a year and a half ago he has developed mild recurrent right calf claudication with Dopplers performed 08/29/2022 that showed a decline in his right ABI 0.80 with reocclusion of his mid right SFA.  He has recurrent claudication but not as severe as his preintervention symptoms.   Current Meds  Medication Sig   amLODipine (NORVASC) 10 MG tablet Take 1 tablet by mouth once daily   doxycycline (VIBRAMYCIN) 100 MG capsule Take 100 mg by mouth 2 (two) times daily.    SUMAtriptan (IMITREX) 25 MG tablet TAKE 1 TABLET BY MOUTH EVERY 2 HOURS AS NEEDED FOR MIGRAINE. MAY REPEAT IN 2 HOURS IF HEADACHE PERSISTS OR RECURS.     Allergies  Allergen Reactions   Contrast Media [Iodinated Contrast Media] Hives    Social History   Socioeconomic History   Marital status: Married    Spouse name: Not on file   Number of children: Not on file   Years of education: Not on file   Highest education level: Not on file  Occupational History   Not on file  Tobacco Use   Smoking status: Some Days    Packs/day: 1.00    Types: Cigars, Cigarettes   Smokeless tobacco: Never  Vaping Use   Vaping Use: Never used  Substance and Sexual Activity   Alcohol use: Yes    Alcohol/week: 6.0 standard drinks of alcohol    Types: 4 Cans of beer, 2 Shots of liquor per week   Drug use: No   Sexual activity: Not Currently    Partners: Female  Other Topics Concern   Not on file  Social History Narrative   Not on file   Social Determinants of Health   Financial Resource Strain: Not on file  Food Insecurity: Not on file  Transportation Needs: Not on file  Physical Activity: Not on file  Stress: Not on file  Social Connections: Not on file  Intimate Partner Violence: Not on file     Review of Systems: General: negative  for chills, fever, night sweats or weight changes.  Cardiovascular: negative for chest pain, dyspnea on exertion, edema, orthopnea, palpitations, paroxysmal nocturnal dyspnea or shortness of breath Dermatological: negative for rash Respiratory: negative for cough or wheezing Urologic: negative for hematuria Abdominal: negative for nausea, vomiting, diarrhea, bright red blood per rectum, melena, or hematemesis Neurologic: negative for visual changes, syncope, or dizziness All other systems reviewed and are otherwise negative except as noted above.    Blood pressure (!) 142/84, pulse 84, height '6\' 2"'$  (1.88 m), weight 207 lb 3.2 oz (94 kg), SpO2 97 %.   General appearance: alert and no distress Neck: no adenopathy, no carotid bruit, no JVD, supple, symmetrical, trachea midline, and thyroid not enlarged, symmetric, no tenderness/mass/nodules Lungs: clear to auscultation bilaterally Heart: regular rate and rhythm, S1, S2 normal, no murmur, click, rub or gallop Extremities: extremities normal, atraumatic, no cyanosis or edema Pulses: Decreased pedal pulses Skin: Skin color, texture, turgor normal. No rashes or lesions Neurologic: Grossly normal  EKG sinus rhythm at 84 with left axis deviation and Q waves in lead III and F.  I personally reviewed this EKG.  ASSESSMENT AND PLAN:   Peripheral arterial disease ([place]) History of peripheral arterial disease status post right SFA directional atherectomy followed by drug coated balloon angioplasty 01/12/2020 for a short segment mid right SFA CTO with two-vessel runoff.  His claudication resolved after that and is Dopplers normalized.  He did have a 90% mid left SFA stenosis with two-vessel runoff but he was asymptomatic on that side.  His most recent Doppler studies performed 08/29/2022 revealed a decline in his right ABI down to 0.80.  His left ABI remains normal.  He does have a recurrent occlusion of his mid right SFA with mild claudication.  At this point, his symptoms are not as severe as they were prior to his initial intervention.  We decided to try empiric Pletal 50 mg p.o. twice daily to see whether this affords him significant improvement.  Will reassess in 3 months.  At that time, we will discuss whether or not we should proceed with reintervention.     Lorretta Harp MD FACP,FACC,FAHA, [facility] 10/15/2022 9:19 AM

## 2022-10-15 NOTE — Progress Notes (Signed)
EKG

## 2022-10-15 NOTE — Patient Instructions (Signed)
Medication Instructions:  Your physician has recommended you make the following change in your medication:   -Start cilostazol (pletal) '50mg'$  twice daily.  *If you need a refill on your cardiac medications before your next appointment, please call your pharmacy*   Follow-Up: At [facility], you and your health needs are our priority.  As part of our continuing mission to provide you with exceptional heart care, we have created designated Provider Care Teams.  These Care Teams include your primary Cardiologist (physician) and Advanced Practice Providers (APPs -  Physician Assistants and Nurse Practitioners) who all work together to provide you with the care you need, when you need it.  We recommend signing up for the patient portal called "MyChart".  Sign up information is provided on this After Visit Summary.  MyChart is used to connect with patients for Virtual Visits (Telemedicine).  Patients are able to view lab/test results, encounter notes, upcoming appointments, etc.  Non-urgent messages can be sent to your provider as well.   To learn more about what you can do with MyChart, go to [email or web address].    Your next appointment:   3 month(s)  The format for your next appointment:   In Person  Provider:   Quay Burow, MD

## 2022-11-21 ENCOUNTER — Telehealth: Payer: Self-pay | Admitting: Cardiovascular Disease

## 2022-11-21 ENCOUNTER — Other Ambulatory Visit: Payer: Self-pay | Admitting: Family Medicine

## 2022-11-21 DIAGNOSIS — Z8669 Personal history of other diseases of the nervous system and sense organs: Secondary | ICD-10-CM

## 2022-11-21 NOTE — Telephone Encounter (Signed)
*  STAT* If patient is at the pharmacy, call can be transferred to refill team.   1. Which medications need to be refilled? (please list name of each medication and dose if known)   SUMAtriptan (IMITREX) 25 MG tablet   2. Which pharmacy/location (including street and city if local pharmacy) is medication to be sent to?  SUMAtriptan (IMITREX) 25 MG tablet   3. Do they need a 30 day or 90 day supply? 90 day  Patient stated he is completely out of this medication.

## 2022-11-21 NOTE — Telephone Encounter (Signed)
Requested med is Rx'ed by PCP Advised was refilled today

## 2022-11-25 ENCOUNTER — Telehealth: Payer: Self-pay | Admitting: Family Medicine

## 2022-11-25 NOTE — Telephone Encounter (Addendum)
Pt called to request a refill of his migraine medication, but could not remember the name of this medication.  Pt stated he needs this done today!!  Pt was informed MD is out on Tuesdays.  LOV:  Feb. 2023  Please advise.   [place], [address] Phone: [phone]  Fax: [phone]     Pt has an CPE scheduled for 12/03/22.

## 2022-11-25 NOTE — Telephone Encounter (Signed)
This was already sent in on 11/21/22 to the [facility].

## 2022-11-27 NOTE — Telephone Encounter (Signed)
Patient informed of the message and verbalized understanding. Patient stated he does not have the appropriate funds to visit ED so he requested an appointment tomorrow with PCP

## 2022-11-27 NOTE — Telephone Encounter (Signed)
If prescription for Imitrex was sent in on 11/21/2022 and patient is already out of the medication he needs to be seen.  Can proceed to nearest emergency department for persistent migraine.

## 2022-11-27 NOTE — Telephone Encounter (Signed)
Pt is calling and he is finished with SUMAtriptan (IMITREX) 25 MG tablet and would like #10 more . Pt does have cpe on 12-03-2022 [name], [place] - [place] Phone: [phone]  Fax: [phone]

## 2022-11-28 ENCOUNTER — Ambulatory Visit: Payer: BC Managed Care – PPO | Admitting: Family Medicine

## 2022-11-28 ENCOUNTER — Encounter: Payer: Self-pay | Admitting: Family Medicine

## 2022-11-28 VITALS — BP 132/84 | HR 80 | Temp 98.0°F | Ht 74.0 in | Wt 211.6 lb

## 2022-11-28 DIAGNOSIS — I10 Essential (primary) hypertension: Secondary | ICD-10-CM | POA: Diagnosis not present

## 2022-11-28 DIAGNOSIS — G43C0 Periodic headache syndromes in child or adult, not intractable: Secondary | ICD-10-CM | POA: Diagnosis not present

## 2022-11-28 DIAGNOSIS — I5033 Acute on chronic diastolic (congestive) heart failure: Secondary | ICD-10-CM

## 2022-11-28 DIAGNOSIS — I739 Peripheral vascular disease, unspecified: Secondary | ICD-10-CM | POA: Diagnosis not present

## 2022-11-28 DIAGNOSIS — R0683 Snoring: Secondary | ICD-10-CM

## 2022-11-28 DIAGNOSIS — M542 Cervicalgia: Secondary | ICD-10-CM

## 2022-11-28 MED ORDER — UBRELVY 50 MG PO TABS: ORAL_TABLET | ORAL | 1 refills | Status: AC

## 2022-11-28 MED ORDER — ROSUVASTATIN CALCIUM 20 MG PO TABS: 20.0000 mg | ORAL_TABLET | Freq: Every day | ORAL | 3 refills | Status: DC

## 2022-11-28 MED ORDER — AMLODIPINE BESYLATE 10 MG PO TABS: 10.0000 mg | ORAL_TABLET | Freq: Every day | ORAL | 3 refills | Status: DC

## 2022-11-28 NOTE — Progress Notes (Incomplete)
   Established Patient Office Visit   Subjective  Patient ID: Curtis Patton, male    DOB: 1966/02/12  Age: 57 y.o. MRN: [id number]  Chief Complaint  Patient presents with  . Headache    Pt reports headache on Right side, behind Right eye. Pain radiates to R neck.  Sx started 2wks ago. Worsen at night.   . Insomnia    Pt reports he has trouble with falling asleep. Going on for several months.  . Medical Management of Chronic Issues    HPI  {History (Optional):23778}  ROS Negative unless stated above    Objective:     BP 132/84 (BP Location: Right Arm, Cuff Size: Large)   Pulse 80   Temp 98 F (36.7 C) (Oral)   Ht '6\' 2"'$  (1.88 m)   Wt 211 lb 9.6 oz (96 kg)   SpO2 98%   BMI 27.17 kg/m  {Vitals History (Optional):23777}  Physical Exam   No results found for any visits on 11/28/22.    Assessment & Plan:  Periodic headache syndrome, not intractable Roselyn Meier; Take 1 tab at start of migraine.  May repeat dose in 2 hours if migraine persists.  Dispense: 16 tablet; Refill: 1 -     Ambulatory referral to Pulmonology -     Ambulatory referral to Neurology  Essential hypertension -     amLODIPine Besylate; Take 1 tablet (10 mg total) by mouth daily.  Dispense: 90 tablet; Refill: 3 -     Ambulatory referral to Pulmonology  Acute on chronic diastolic CHF (congestive heart failure) (HCC) -     amLODIPine Besylate; Take 1 tablet (10 mg total) by mouth daily.  Dispense: 90 tablet; Refill: 3  PAD (peripheral artery disease) (HCC) -     Rosuvastatin Calcium; Take 1 tablet (20 mg total) by mouth daily.  Dispense: 90 tablet; Refill: 3  Snoring -     Ambulatory referral to Pulmonology  Neck pain on right side    Return in about 3 weeks (around 12/19/2022).   Billie Ruddy, MD

## 2022-11-28 NOTE — Patient Instructions (Signed)
GENERAL HEADACHE INSTRUCTIONS Headache Preventive Treatment: Please keep in mind that it takes 4-6 weeks for the medication to start working well and 2-3 months at the appropriate dose before deciding if it will be useful or not. If it is not helping at all by this time, then we will discuss other medications to try. Supplements may take 3-6 months until you see full effect.    Natural supplements: Magnesium Oxide or Magnesium Glycinate 500 mg at bed (up to 800 mg daily) Coenzyme Q10 300 mg in AM Vitamin B2- 200 mg twice a day   Add 1 supplement at a time since even natural supplements can have undesirable side effects. You can sometimes buy supplements cheaper (especially Coenzyme Q10) at www.https://compton-perez.com/ or at [employer].   Vitamins and herbs that show potential:   Magnesium: Magnesium (250 mg twice a day or 500 mg at bed) has a relaxant effect on smooth muscles such as blood vessels. Individuals suffering from frequent or daily headache usually have low magnesium levels which can be increase with daily supplementation of 400-750 mg. Three trials found 40-90% average headache reduction  when used as a preventative. Magnesium also demonstrated the benefit in menstrually related migraine.  Magnesium is part of the messenger system in the serotonin cascade and it is a good muscle relaxant.  It is also useful for constipation which can be a side effect of other medications used to treat migraine. Good sources include nuts, whole grains, and tomatoes. Side Effects: loose stool/diarrhea Riboflavin (vitamin B 2) 200 mg twice a day. This vitamin assists nerve cells in the production of ATP a principal energy storing molecule.  It is necessary for many chemical reactions in the body.  There have been at least 3 clinical trials of riboflavin using 400 mg per day all of which suggested that migraine frequency can be decreased.  All 3 trials showed significant improvement in over half of migraine sufferers.  The  supplement is found in bread, cereal, milk, meat, and poultry.  Most Americans get more riboflavin than the recommended daily allowance, however riboflavin deficiency is not necessary for the supplements to help prevent headache. Side effects: energizing, green urine   Coenzyme Q10: This is present in almost all cells in the body and is critical component for the conversion of energy.  Recent studies have shown that a nutritional supplement of CoQ10 can reduce the frequency of migraine attacks by improving the energy production of cells as with riboflavin.  Doses of 150 mg twice a day have been shown to be effective.   Melatonin: Increasing evidence shows correlation between melatonin secretion and headache conditions.  Melatonin supplementation has decreased headache intensity and duration.  It is widely used as a sleep aid.  Sleep is natures way of dealing with migraine.  A dose of 3 mg is recommended to start for headaches including cluster headache. Higher doses up to 15 mg has been reviewed for use in Cluster headache and have been used. The rationale behind using melatonin for cluster is that many theories regarding the cause of Cluster headache center around the disruption of the normal circadian rhythm in the brain.  This helps restore the normal circadian rhythm.     HEADACHE DIET: Foods and beverages which may trigger migraine Note that only 20% of headache patients are food sensitive. You will know if you are food sensitive if you get a headache consistently 20 minutes to 2 hours after eating a certain food. Only cut out a food  if it causes headaches, otherwise you might remove foods you enjoy! What matters most for diet is to eat a well balanced healthy diet full of vegetables and low fat protein, and to not miss meals.   Chocolate, other sweets ALL cheeses except cottage and cream cheese Dairy products, yogurt, sour cream, ice cream Liver Meat extracts (Bovril, Marmite, meat  tenderizers) Meats or fish which have undergone aging, fermenting, pickling or smoking. These include: Hotdogs,salami,Lox,sausage, mortadellas,smoked salmon, pepperoni, Pickled herring Pods of broad bean (English beans, Chinese pea pods, [place] (fava) beans, lima and navy beans Ripe avocado, ripe banana Yeast extracts or active yeast preparations such as Brewer's or Fleishman's (commercial bakes goods are permitted) Tomato based foods, pizza (lasagna, etc.)   MSG (monosodium glutamate) is disguised as many things; look for these common aliases: Monopotassium glutamate Autolysed yeast Hydrolysed protein Sodium caseinate "flavorings" "all natural preservatives" Nutrasweet   Avoid all other foods that convincingly provoke headaches.   Resources: The Dizzy Lu Duffel Your Headache Diet, migrainestrong.com  https://www.aguirre.org/   Caffeine and Migraine For patients that have migraine, caffeine intake more than 3 days per week can lead to dependency and increased migraine frequency. I would recommend cutting back on your caffeine intake as best you can. The recommended amount of caffeine is 200-300 mg daily, although migraine patients may experience dependency at even lower doses. While you may notice an increase in headache temporarily, cutting back will be helpful for headaches in the long run. For more information on caffeine and migraine, visit: https://americanmigrainefoundation.org/resource-library/caffeine-and-migraine/   Headache Prevention Strategies:   1. Maintain a headache diary; learn to identify and avoid triggers.  - This can be a simple note where you log when you had a headache, associated symptoms, and medications used - There are several smartphone apps developed to help track migraines: Migraine Buddy, Migraine Monitor, Curelator N1-Headache App   Common triggers include: Emotional triggers: Emotional/Upset family or  friends Emotional/Upset occupation Business reversal/success Anticipation anxiety Crisis-serious Post-crisis periodNew job/position   Physical triggers: Vacation Day Weekend Strenuous Exercise High Altitude Location New Move Menstrual Day Physical Illness Oversleep/Not enough sleep Weather changes Light: Photophobia or light sesnitivity treatment involves a balance between desensitization and reduction in overly strong input. Use dark polarized glasses outside, but not inside. Avoid bright or fluorescent light, but do not dim environment to the point that going into a normally lit room hurts. Consider FL-41 tint lenses, which reduce the most irritating wavelengths without blocking too much light.  These can be obtained at axonoptics.com or theraspecs.com Foods: see list above.   2. Limit use of acute treatments (over-the-counter medications, triptans, etc.) to no more than 2 days per week or 10 days per month to prevent medication overuse headache (rebound headache).     3. Follow a regular schedule (including weekends and holidays): Don't skip meals. Eat a balanced diet. 8 hours of sleep nightly. Minimize stress. Exercise 30 minutes per day. Being overweight is associated with a 5 times increased risk of chronic migraine. Keep well hydrated and drink 6-8 glasses of water per day.   4. Initiate non-pharmacologic measures at the earliest onset of your headache. Rest and quiet environment. Relax and reduce stress. Breathe2Relax is a free app that can instruct you on    some simple relaxtion and breathing techniques. Http://Dawnbuse.com is a    free website that provides teaching videos on relaxation.  Also, there are  many apps that   can be downloaded for "mindful" relaxation.  An app called  YOGA NIDRA will help walk you through mindfulness. Another app called Calm can be downloaded to give you a structured mindfulness guide with daily reminders and skill development. Headspace for  guided meditation Mindfulness Based Stress Reduction Online Course: www.palousemindfulness.com Cold compresses.   5. Don't wait!! Take the maximum allowable dosage of prescribed medication at the first sign of migraine.   6. Compliance:  Take prescribed medication regularly as directed and at the first sign of a migraine.   7. Communicate:  Call your physician when problems arise, especially if your headaches change, increase in frequency/severity, or become associated with neurological symptoms (weakness, numbness, slurred speech, etc.).   8. Headache/pain management therapies: Consider various complementary methods, including medication, behavioral therapy, psychological counselling, biofeedback, massage therapy, acupuncture, dry needling, and other modalities.  Such measures may reduce the need for medications. Counseling for pain management, where patients learn to function and ignore/minimize their pain, seems to work very well.   9. Recommend changing family's attention and focus away from patient's headaches. Instead, emphasize daily activities. If first question of day is 'How are your headaches/Do you have a headache today?', then patient will constantly think about headaches, thus making them worse. Goal is to re-direct attention away from headaches, toward daily activities and other distractions.   10. Helpful Websites: www.AmericanHeadacheSociety.org VoipObserver.it www.headaches.org GolfingFamily.no www.achenet.org   11. HEADACHE EXPECTATIONS: There are many types of headaches, and only a rare few in which complete relief can be expected.  In general, there is no cure for headache, especially migraine based headaches.  There is nothing available that completely prevents headaches from occurring, breaking through, or having periodic flare-ups and fluctuations.  Regardless of what you are using on a daily basis for prevention, episodic headaches should still be expected, and  periods where frequency may escalate and fluctuate are unavoidable.   There is no quick fix for most headaches.  Furthermore, the longer you have had high frequency headaches (such as chronic daily headache), the longer it will likely take to expect any improvement.  In fact, some people will never improve, regardless of how many medications or other treatments we try.  Our treatment strategy is to evaluate for possible causes of your headache, although testing is usually always normal, even in cases of daily continuous headaches for years.  Most types of headache such as migraine are electrical brain disorders (similar to how epilepsy is an electrical brain disorders).  Therefore, there is no testing that will reveal this "dysfunctional electrical circuitry" such on MRI, or other testing.  We try to find a medication that may help lessen the frequency and/or severity of your headaches.  The goal is not to completely stop them from happening, although if that happens, great!  Different people respond to different medications, and some people just don't respond to anything, so it's usually a matter of trying different options.  We cannot predict if or when exactly you will respond to a treatment that we provide.   Preventive headache medications take 4-6 weeks to start working, and 2-3 months to see full effect, assuming you reach an effective dose.  Therefore, calling or messaging frequently because you have a headache flare prior to the 3 month mark is unlikely to change anything, and unfortunately there is nothing available that will expedite this, so please try to avoid this.  Our recommendation will generally be to give it adequate time first.  If you are unable to wait it out for medications to work, we can  also try IV infusions for some temporary relief.     In general, the best that preventive medications or other treatments (including Botox) are able to offer in migraine management (variable in other  headache types) is a 50% improvement in frequency and/or severity of headache.  That is our goal, and any additional benefit is considered a bonus.  Some people do significantly better than this, others do not get close to this.  Therefore, if your headaches are not improving by at least 3 months on your preventive strategy, contact us and we can discuss further adjustments.  Keep in mind that complete headache cure is not a realistic expectation.

## 2022-12-03 ENCOUNTER — Encounter: Payer: BC Managed Care – PPO | Admitting: Family Medicine

## 2022-12-04 ENCOUNTER — Encounter: Payer: Self-pay | Admitting: Neurology

## 2022-12-19 ENCOUNTER — Encounter: Payer: Self-pay | Admitting: Family Medicine

## 2022-12-19 ENCOUNTER — Ambulatory Visit: Payer: BC Managed Care – PPO | Admitting: Family Medicine

## 2022-12-19 VITALS — BP 134/80 | HR 77 | Temp 98.1°F | Ht 74.0 in | Wt 223.8 lb

## 2022-12-19 DIAGNOSIS — G43C0 Periodic headache syndromes in child or adult, not intractable: Secondary | ICD-10-CM

## 2022-12-19 DIAGNOSIS — E782 Mixed hyperlipidemia: Secondary | ICD-10-CM | POA: Diagnosis not present

## 2022-12-19 DIAGNOSIS — I10 Essential (primary) hypertension: Secondary | ICD-10-CM

## 2022-12-19 DIAGNOSIS — Z125 Encounter for screening for malignant neoplasm of prostate: Secondary | ICD-10-CM

## 2022-12-19 DIAGNOSIS — I739 Peripheral vascular disease, unspecified: Secondary | ICD-10-CM

## 2022-12-19 DIAGNOSIS — I5032 Chronic diastolic (congestive) heart failure: Secondary | ICD-10-CM

## 2022-12-19 DIAGNOSIS — R635 Abnormal weight gain: Secondary | ICD-10-CM | POA: Diagnosis not present

## 2022-12-19 LAB — COMPREHENSIVE METABOLIC PANEL
ALT: 45 U/L (ref 0–53)
AST: 22 U/L (ref 0–37)
Albumin: 4.2 g/dL (ref 3.5–5.2)
Alkaline Phosphatase: 78 U/L (ref 39–117)
BUN: 16 mg/dL (ref 6–23)
CO2: 27 mEq/L (ref 19–32)
Calcium: 9.5 mg/dL (ref 8.4–10.5)
Chloride: 105 mEq/L (ref 96–112)
Creatinine, Ser: 0.94 mg/dL (ref 0.40–1.50)
GFR: 90.21 mL/min (ref >60.00)
Glucose, Bld: 74 mg/dL (ref 70–99)
Potassium: 4.3 mEq/L (ref 3.5–5.1)
Sodium: 141 mEq/L (ref 135–145)
Total Bilirubin: 0.6 mg/dL (ref 0.2–1.2)
Total Protein: 7.1 g/dL (ref 6.0–8.3)

## 2022-12-19 LAB — LIPID PANEL
Cholesterol: 165 mg/dL (ref 0–200)
HDL: 65.2 mg/dL (ref >39.00)
LDL Cholesterol: 88 mg/dL (ref 0–99)
NonHDL: 99.8
Total CHOL/HDL Ratio: 3
Triglycerides: 60 mg/dL (ref 0.0–149.0)
VLDL: 12 mg/dL (ref 0.0–40.0)

## 2022-12-19 LAB — CBC WITH DIFFERENTIAL/PLATELET
Basophils Absolute: 0.1 10*3/uL (ref 0.0–0.1)
Basophils Relative: 0.6 % (ref 0.0–3.0)
Eosinophils Absolute: 0.4 10*3/uL (ref 0.0–0.7)
Eosinophils Relative: 4.1 % (ref 0.0–5.0)
HCT: 45.3 % (ref 39.0–52.0)
Hemoglobin: 15 g/dL (ref 13.0–17.0)
Lymphocytes Relative: 25.7 % (ref 12.0–46.0)
Lymphs Abs: 2.4 10*3/uL (ref 0.7–4.0)
MCHC: 33.2 g/dL (ref 30.0–36.0)
MCV: 80.6 fl (ref 78.0–100.0)
Monocytes Absolute: 1.1 10*3/uL — ABNORMAL HIGH (ref 0.1–1.0)
Monocytes Relative: 12 % (ref 3.0–12.0)
Neutro Abs: 5.3 10*3/uL (ref 1.4–7.7)
Neutrophils Relative %: 57.6 % (ref 43.0–77.0)
Platelets: 276 10*3/uL (ref 150.0–400.0)
RBC: 5.61 Mil/uL (ref 4.22–5.81)
RDW: 14.5 % (ref 11.5–15.5)
WBC: 9.1 10*3/uL (ref 4.0–10.5)

## 2022-12-19 LAB — HEMOGLOBIN A1C: Hgb A1c MFr Bld: 6 % (ref 4.6–6.5)

## 2022-12-19 LAB — TSH: TSH: 0.49 u[IU]/mL (ref 0.35–5.50)

## 2022-12-19 LAB — PSA: PSA: 0.42 ng/mL (ref 0.10–4.00)

## 2022-12-19 NOTE — Progress Notes (Signed)
Established Patient Office Visit   Subjective  Patient ID: Curtis Patton, male    DOB: [birth year]  Age: 57 y.o. MRN: [id number]  Chief Complaint  Patient presents with   Medical Management of Chronic Issues    Pt is a 57 yo male with past medical history of (HFpEF) heart failure with preserved ejection fraction, Hypertension, PVD, ED, HLD, and Migraine seen for f/u.  Pt states HAs resolved after taking ubrelvy and imitrex a few wks ago.  Has upcoming appt with Neurology.  Endorses episodic HAs once a yr around the same time.  Pt working on stress.  Not really checking bp or wt at home.  Stopped taking most of the meds on med list.  Only taking norvasx 10 mg for HTN.  Notes wt gain since last OFV.  Denies SOB, LE edema.  States was eating junk/snacking more to see if eating would help the HAs.  Pt also has an appt coming up with pulmonology for sleep study 2/2 snoring and a cardiology appt.  Pt       ROS Negative unless stated above    Objective:     BP 134/80 (BP Location: Left Arm, Patient Position: Sitting, Cuff Size: Large)   Pulse 77   Temp 98.1 F (36.7 C) (Oral)   Ht '6\' 2"'$  (1.88 m)   Wt 223 lb 12.8 oz (101.5 kg)   SpO2 99%   BMI 28.73 kg/m    Physical Exam Constitutional:      General: He is not in acute distress.    Appearance: Normal appearance.  HENT:     Head: Normocephalic and atraumatic.     Nose: Nose normal.     Mouth/Throat:     Mouth: Mucous membranes are moist.  Eyes:     Extraocular Movements: Extraocular movements intact.     Conjunctiva/sclera: Conjunctivae normal.     Pupils: Pupils are equal, round, and reactive to light.  Cardiovascular:     Rate and Rhythm: Normal rate and regular rhythm.     Heart sounds: Normal heart sounds. No murmur heard.    No gallop.  Pulmonary:     Effort: Pulmonary effort is normal. No respiratory distress.     Breath sounds: Normal breath sounds. No wheezing, rhonchi or rales.  Musculoskeletal:     Right  lower leg: Edema present.     Left lower leg: No edema.  Skin:    General: Skin is warm and dry.  Neurological:     Mental Status: He is alert and oriented to person, place, and time.      No results found for any visits on 12/19/22.    Assessment & Plan:  Essential hypertension -Controlled -Continue Norvasc 10 mg daily -Lifestyle modifications encouraged -Patient encouraged to keep a log of BP readings to bring to clinic along with BP cuff. -     Comprehensive metabolic panel -     TSH  Periodic headache syndrome, not intractable -Resolved -Continue Ubrelvy and Imitrex as needed -Encouraged to keep upcoming neurology appointment. -     CBC with Differential/Platelet  PAD (peripheral artery disease) (HCC) -no longer taking plavix, ASA, or cilostazol -continue crestor 20 mg -     Lipid panel -     CBC with Differential/Platelet  Chronic diastolic congestive heart failure ([place]) -Lifestyle modifications encouraged -Patient advised to monitor weight daily -No longer taking losartan, Coreg.  Meds removed from med list. -Continue follow-up with cardiology  Weight gain -  TSH -     Hemoglobin A1c  Screening for prostate cancer -     PSA  Mixed hyperlipidemia -continue crestor 20 mg -     Lipid panel   Return if symptoms worsen or fail to improve.   Billie Ruddy, MD

## 2022-12-22 ENCOUNTER — Encounter: Payer: Self-pay | Admitting: Nurse Practitioner

## 2022-12-22 ENCOUNTER — Ambulatory Visit (INDEPENDENT_AMBULATORY_CARE_PROVIDER_SITE_OTHER): Payer: BC Managed Care – PPO | Admitting: Nurse Practitioner

## 2022-12-22 VITALS — BP 124/72 | HR 85 | Ht 74.0 in | Wt 227.6 lb

## 2022-12-22 DIAGNOSIS — G4719 Other hypersomnia: Secondary | ICD-10-CM | POA: Diagnosis not present

## 2022-12-22 DIAGNOSIS — R0683 Snoring: Secondary | ICD-10-CM

## 2022-12-22 DIAGNOSIS — G479 Sleep disorder, unspecified: Secondary | ICD-10-CM

## 2022-12-22 NOTE — Assessment & Plan Note (Signed)
Restless sleep. Discussed that this can be the cause of sleep apnea. He has never tried any OTC sleep aids/melatonin. Advised he try melatonin while we wait on HST. Depending on workup and if he continues to have restless sleep, we can discuss pharmacological therapy in the future. Sleep hygiene reviewed.

## 2022-12-22 NOTE — Patient Instructions (Signed)
Given your symptoms and history, I am concerned that you may have sleep disordered with sleep apnea. You will need a sleep study for further evaluation. Someone will contact you to schedule this.  We discussed how untreated sleep apnea puts an individual at risk for cardiac arrhthymias, pulm HTN, DM, stroke and increases their risk for daytime accidents. We also briefly reviewed treatment options including weight loss, side sleeping position, oral appliance, CPAP therapy or referral to ENT for possible surgical options  Try over the counter melatonin to help with sleep onset and maintenance   Follow up in 6-8 weeks with Dr. Ander Slade (new pt 30 min slot) or Curtis Rino Hosea,NP. If symptoms do not improve or worsen, please contact office for sooner follow up or seek emergency care.

## 2022-12-22 NOTE — Progress Notes (Signed)
$'@Patient'T$  ID: Curtis Patton, male    DOB: 01-23-1966, 57 y.o.   MRN: [id number]  Chief Complaint  Patient presents with   Consult    PT consult never has a sleep study reports he snore and has some morning headaches/dry mouth. Gets approx 5-6hrs/night of sleep    Referring provider: Billie Ruddy, MD  HPI: 57 year old male, former smoker referred for sleep consult. Past medical history significant for CHF, HTN, PAD, migraines.  TEST/EVENTS:   12/22/2022: Today - sleep consult Patient presents today for sleep consult, referred by Dr. Volanda Napoleon.  He has had trouble with restless sleep for many years now.  Wakes numerous times throughout the night.  Feels like he is up every 2-3 hours.  He also snores, has occasional morning headaches and morning dry mouth.  Has some occasional daytime fatigue symptoms but depends on how he slept the night before.  Denies any witnessed apneas, drowsy driving, sleep parasomnia/paralysis.  No symptoms of narcolepsy or cataplexy. He goes to bed around 10:30 PM.  Falls asleep quickly, within 15 to 20 minutes.  Wakes numerous times at night.  Officially gets up around 6 AM.  No sleep aids.  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  Weight has fluctuated up and down 20 pounds over the last 2 years.  Never had a sleep study before. He has a history of high blood pressure, CHF and high cholesterol.  No history of stroke.  He is a former smoker.  Quit January 2024; 1 pack/day.  He is also no longer drinking any alcohol.  He quit drinking in January 2024 as well.  No excessive caffeine intake.  Has an occasional soda.  Currently living alone.  Separated.  Works for [employer].  Family history of heart disease.  Epworth 8  Allergies  Allergen Reactions   Contrast Media [Iodinated Contrast Media] Hives    Immunization History  Administered Date(s) Administered   Influenza,inj,Quad PF,6+ Mos 08/11/2018, 07/17/2019, 08/01/2020    PFIZER(Purple Top)SARS-COV-2 Vaccination 12/24/2019, 01/14/2020   Pneumococcal Polysaccharide-23 10/26/2019   Tdap 06/12/2016    Past Medical History:  Diagnosis Date   (HFpEF) heart failure with preserved ejection fraction ([facility])    a. Echo 08/10/2018: LVEF 50-55% w/ mild LVH, mild diffuse hypokinesis w/ no regional variation, and G1DD   Acute CHF ([facility])    Acute renal insufficiency    Acute respiratory failure with hypoxia ([facility])    Hypertension    Migraine     Tobacco History: Social History   Tobacco Use  Smoking Status Former   Packs/day: 1.00   Types: Cigars, Cigarettes   Quit date: 10/21/2022   Years since quitting: 0.1  Smokeless Tobacco Never   Counseling given: Not Answered   Outpatient Medications Prior to Visit  Medication Sig Dispense Refill   amLODipine (NORVASC) 10 MG tablet Take 1 tablet (10 mg total) by mouth daily. 90 tablet 3   Menthol, Topical Analgesic, (ICY HOT EX) Apply 1 application  topically 4 (four) times daily as needed (pain.).     rosuvastatin (CRESTOR) 20 MG tablet Take 1 tablet (20 mg total) by mouth daily. 90 tablet 3   sildenafil (VIAGRA) 25 MG tablet Take 1 tablet (25 mg total) by mouth daily as needed for erectile dysfunction. 25 tablet 0   SUMAtriptan (IMITREX) 25 MG tablet TAKE 1 TABLET BY MOUTH EVERY 2 HOURS AS NEEDED FOR  MIGRAINE. MAY  REPEAT  IN  2  HOURS  IF MIGRAINE PERSISTS. (Patient taking differently: TAKE 1 TABLET BY MOUTH EVERY 2 HOURS AS NEEDED FOR  MIGRAINE. MAY  REPEAT  IN  2  HOURS  IF MIGRAINE PERSISTS.) 10 tablet 0   Ubrogepant (UBRELVY) 50 MG TABS Take 1 tab at start of migraine.  May repeat dose in 2 hours if migraine persists. 16 tablet 1   No facility-administered medications prior to visit.     Review of Systems:   Constitutional: No night sweats, fevers, chills, or lassitude. +daytime fatigue, fluctuating weight HEENT: No difficulty swallowing, tooth/dental problems, or sore throat. No sneezing, itching, ear ache,  or post nasal drip. +chronic headaches, AM dry mouth, occasional nasal congestion CV:  No chest pain, orthopnea, PND, swelling in lower extremities, anasarca, dizziness, palpitations, syncope Resp: +snoring, shortness of breath with exertion (baseline). No excess mucus or change in color of mucus. No productive or non-productive. No hemoptysis. No wheezing.  No chest wall deformity GI:  No heartburn, indigestion, abdominal pain, nausea, vomiting, diarrhea, change in bowel habits, loss of appetite, bloody stools.  GU: No dysuria, change in color of urine, urgency or frequency.   Skin: No rash, lesions, ulcerations MSK:  No joint pain or swelling.   Neuro: No dizziness or lightheadedness.  Psych: No depression or anxiety. Mood stable.     Physical Exam:  BP 124/72   Pulse 85   Ht '6\' 2"'$  (1.88 m)   Wt 227 lb 9.6 oz (103.2 kg)   SpO2 98%   BMI 29.22 kg/m   GEN: Pleasant, interactive, well-appearing; in no acute distress. HEENT:  Normocephalic and atraumatic. PERRLA. Sclera white. Nasal turbinates pink, moist and patent bilaterally. No rhinorrhea present. Oropharynx pink and moist, without exudate or edema. No lesions, ulcerations, or postnasal drip. Mallampati III NECK:  Supple w/ fair ROM. No JVD present. Normal carotid impulses w/o bruits. Thyroid symmetrical with no goiter or nodules palpated. No lymphadenopathy.   CV: RRR, no m/r/g, no peripheral edema. Pulses intact, +2 bilaterally. No cyanosis, pallor or clubbing. PULMONARY:  Unlabored, regular breathing. Clear bilaterally A&P w/o wheezes/rales/rhonchi. No accessory muscle use.  GI: BS present and normoactive. Soft, non-tender to palpation. No organomegaly or masses detected.  MSK: No erythema, warmth or tenderness. Cap refil <2 sec all extrem. No deformities or joint swelling noted.  Neuro: A/Ox3. No focal deficits noted.   Skin: Warm, no lesions or rashe Psych: Normal affect and behavior. Judgement and thought content appropriate.      Lab Results:  CBC    Component Value Date/Time   WBC 9.1 12/19/2022 0933   RBC 5.61 12/19/2022 0933   HGB 15.0 12/19/2022 0933   HGB 16.0 01/04/2020 1233   HCT 45.3 12/19/2022 0933   HCT 48.0 01/04/2020 1233   PLT 276.0 12/19/2022 0933   PLT 282 01/04/2020 1233   MCV 80.6 12/19/2022 0933   MCV 84 01/04/2020 1233   MCH 27.9 01/04/2020 1233   MCH 27.8 08/09/2018 1244   MCHC 33.2 12/19/2022 0933   RDW 14.5 12/19/2022 0933   RDW 14.3 01/04/2020 1233   LYMPHSABS 2.4 12/19/2022 0933   LYMPHSABS 3.2 (H) 01/04/2020 1233   MONOABS 1.1 (H) 12/19/2022 0933   EOSABS 0.4 12/19/2022 0933   EOSABS 0.4 01/04/2020 1233   BASOSABS 0.1 12/19/2022 0933   BASOSABS 0.1 01/04/2020 1233    BMET    Component Value Date/Time   NA 141 12/19/2022 0933   NA 139 01/04/2020 1233   K 4.3 12/19/2022 0933  CL 105 12/19/2022 0933   CO2 27 12/19/2022 0933   GLUCOSE 74 12/19/2022 0933   BUN 16 12/19/2022 0933   BUN 11 01/04/2020 1233   CREATININE 0.94 12/19/2022 0933   CREATININE 0.89 02/17/2013 1633   CALCIUM 9.5 12/19/2022 0933   GFRNONAA 92 01/04/2020 1233   GFRAA 106 01/04/2020 1233    BNP    Component Value Date/Time   BNP 639.7 (H) 08/09/2018 1244     Imaging:  No results found.        No data to display          No results found for: "NITRICOXIDE"      Assessment & Plan:   Snoring He has snoring, daytime sleepiness, AM dry mouth, restless sleep, chronic headaches. BMI 29. Epworth 8. History of HTN, CHF. Given this,  I am concerned he could have sleep disordered breathing with obstructive sleep apnea. He will need sleep study for further evaluation.    - discussed how weight can impact sleep and risk for sleep disordered breathing - discussed options to assist with weight loss: combination of diet modification, cardiovascular and strength training exercises   - had an extensive discussion regarding the adverse health consequences related to untreated sleep  disordered breathing - specifically discussed the risks for hypertension, coronary artery disease, cardiac dysrhythmias, cerebrovascular disease, and diabetes - lifestyle modification discussed   - discussed how sleep disruption can increase risk of accidents, particularly when driving - safe driving practices were discussed  Patient Instructions  Given your symptoms and history, I am concerned that you may have sleep disordered with sleep apnea. You will need a sleep study for further evaluation. Someone will contact you to schedule this.  We discussed how untreated sleep apnea puts an individual at risk for cardiac arrhthymias, pulm HTN, DM, stroke and increases their risk for daytime accidents. We also briefly reviewed treatment options including weight loss, side sleeping position, oral appliance, CPAP therapy or referral to ENT for possible surgical options  Try over the counter melatonin to help with sleep onset and maintenance   Follow up in 6-8 weeks with Dr. Ander Slade (new pt 30 min slot) or Katie Lucita Montoya,NP. If symptoms do not improve or worsen, please contact office for sooner follow up or seek emergency care.    Restless sleeper Restless sleep. Discussed that this can be the cause of sleep apnea. He has never tried any OTC sleep aids/melatonin. Advised he try melatonin while we wait on HST. Depending on workup and if he continues to have restless sleep, we can discuss pharmacological therapy in the future. Sleep hygiene reviewed.   I spent 35 minutes of dedicated to the care of this patient on the date of this encounter to include pre-visit review of records, face-to-face time with the patient discussing conditions above, post visit ordering of testing, clinical documentation with the electronic health record, making appropriate referrals as documented, and communicating necessary findings to members of the patients care team.  Clayton Bibles, NP 12/22/2022  Pt aware and understands  NP's role.

## 2022-12-22 NOTE — Assessment & Plan Note (Signed)
He has snoring, daytime sleepiness, AM dry mouth, restless sleep, chronic headaches. BMI 29. Epworth 8. History of HTN, CHF. Given this,  I am concerned he could have sleep disordered breathing with obstructive sleep apnea. He will need sleep study for further evaluation.    - discussed how weight can impact sleep and risk for sleep disordered breathing - discussed options to assist with weight loss: combination of diet modification, cardiovascular and strength training exercises   - had an extensive discussion regarding the adverse health consequences related to untreated sleep disordered breathing - specifically discussed the risks for hypertension, coronary artery disease, cardiac dysrhythmias, cerebrovascular disease, and diabetes - lifestyle modification discussed   - discussed how sleep disruption can increase risk of accidents, particularly when driving - safe driving practices were discussed  Patient Instructions  Given your symptoms and history, I am concerned that you may have sleep disordered with sleep apnea. You will need a sleep study for further evaluation. Someone will contact you to schedule this.  We discussed how untreated sleep apnea puts an individual at risk for cardiac arrhthymias, pulm HTN, DM, stroke and increases their risk for daytime accidents. We also briefly reviewed treatment options including weight loss, side sleeping position, oral appliance, CPAP therapy or referral to ENT for possible surgical options  Try over the counter melatonin to help with sleep onset and maintenance   Follow up in 6-8 weeks with Curtis Patton (new pt 30 min slot) or Curtis Chablis Losh,NP. If symptoms do not improve or worsen, please contact office for sooner follow up or seek emergency care.

## 2023-01-07 ENCOUNTER — Other Ambulatory Visit: Payer: Self-pay | Admitting: Family Medicine

## 2023-01-07 DIAGNOSIS — N529 Male erectile dysfunction, unspecified: Secondary | ICD-10-CM

## 2023-01-21 ENCOUNTER — Ambulatory Visit (INDEPENDENT_AMBULATORY_CARE_PROVIDER_SITE_OTHER): Payer: BC Managed Care – PPO | Admitting: Primary Care

## 2023-01-21 DIAGNOSIS — R0683 Snoring: Secondary | ICD-10-CM

## 2023-01-21 DIAGNOSIS — G4733 Obstructive sleep apnea (adult) (pediatric): Secondary | ICD-10-CM

## 2023-01-21 DIAGNOSIS — G4719 Other hypersomnia: Secondary | ICD-10-CM

## 2023-01-30 NOTE — Progress Notes (Signed)
01/07/2023 HST with mild OSA and AHI 6/h. Will discuss at f/u

## 2023-02-02 ENCOUNTER — Ambulatory Visit: Payer: BC Managed Care – PPO | Admitting: Nurse Practitioner

## 2023-02-03 ENCOUNTER — Ambulatory Visit: Payer: BC Managed Care – PPO | Admitting: Cardiovascular Disease

## 2023-02-06 ENCOUNTER — Ambulatory Visit (INDEPENDENT_AMBULATORY_CARE_PROVIDER_SITE_OTHER): Payer: BC Managed Care – PPO | Admitting: Nurse Practitioner

## 2023-02-06 ENCOUNTER — Encounter: Payer: Self-pay | Admitting: Nurse Practitioner

## 2023-02-06 VITALS — BP 126/78 | HR 78 | Temp 97.7°F | Ht 74.0 in | Wt 227.8 lb

## 2023-02-06 DIAGNOSIS — Z87891 Personal history of nicotine dependence: Secondary | ICD-10-CM

## 2023-02-06 DIAGNOSIS — G4733 Obstructive sleep apnea (adult) (pediatric): Secondary | ICD-10-CM

## 2023-02-06 NOTE — Progress Notes (Signed)
  ID: Curtis Patton, male    DOB: November 06, 1965, 57 y.o.   MRN: [id number]  Chief Complaint  Patient presents with   Follow-up    Review HST    Referring provider: Deeann Saint, MD  HPI: 57 year old male, former smoker referred for sleep consult 12/22/2022. Past medical history significant for CHF, HTN, PAD, migraines.  TEST/EVENTS:  01/07/2023 HST: AHI 6/h, SpO2 low 88%  12/22/2022: OV with Curtis Gosse NP for sleep consult, referred by Dr. Salomon Patton.  He has had trouble with restless sleep for many years now.  Wakes numerous times throughout the night.  Feels like he is up every 2-3 hours.  He also snores, has occasional morning headaches and morning dry mouth.  Has some occasional daytime fatigue symptoms but depends on how he slept the night before.  Denies any witnessed apneas, drowsy driving, sleep parasomnia/paralysis.  No symptoms of narcolepsy or cataplexy. He goes to bed around 10:30 PM.  Falls asleep quickly, within 15 to 20 minutes.  Wakes numerous times at night.  Officially gets up around 6 AM.  No sleep aids.  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  Weight has fluctuated up and down 20 pounds over the last 2 years.  Never had a sleep study before. He has a history of high blood pressure, CHF and high cholesterol.  No history of stroke.  He is a former smoker.  Quit January 2024; 1 pack/day.  He is also no longer drinking any alcohol.  He quit drinking in January 2024 as well.  No excessive caffeine intake.  Has an occasional soda.  Currently living alone.  Separated.  Works for [employer].  Family history of heart disease. Epworth 8  02/06/2023: Today - follow up Patient presents today for follow up after sleep study. He had HST on 3/20 which showed mild sleep apnea with AHI 6/h. He tells me today that his sleep has actually been better lately. He has been using the melatonin OTC. Seems to be falling asleep quickly and staying asleep except to maybe use  the restroom 1-2 times. He feels better rested. He denies any witnessed apneas, drowsy driving, sleep parasomnias.   Allergies  Allergen Reactions   Contrast Media [Iodinated Contrast Media] Hives   Iodine Hives    Immunization History  Administered Date(s) Administered   Influenza,inj,Quad PF,6+ Mos 08/11/2018, 07/17/2019, 08/01/2020   PFIZER(Purple Top)SARS-COV-2 Vaccination 12/24/2019, 01/14/2020   Pneumococcal Polysaccharide-23 10/26/2019   Tdap 06/12/2016    Past Medical History:  Diagnosis Date   (HFpEF) heart failure with preserved ejection fraction    a. Echo 08/10/2018: LVEF 50-55% w/ mild LVH, mild diffuse hypokinesis w/ no regional variation, and G1DD   Acute CHF    Acute renal insufficiency    Acute respiratory failure with hypoxia    Hypertension    Migraine     Tobacco History: Social History   Tobacco Use  Smoking Status Former   Packs/day: 1.00   Years: 20.00   Additional pack years: 0.00   Total pack years: 20.00   Types: Cigars, Cigarettes   Quit date: 10/21/2022   Years since quitting: 0.2  Smokeless Tobacco Never   Counseling given: Not Answered   Outpatient Medications Prior to Visit  Medication Sig Dispense Refill   amLODipine (NORVASC) 10 MG tablet Take 1 tablet (10 mg total) by mouth daily. 90 tablet 3   cilostazol (PLETAL) 50 MG tablet Take 50 mg by mouth 2 (  two) times daily.     Menthol, Topical Analgesic, (ICY HOT EX) Apply 1 application  topically 4 (four) times daily as needed (pain.).     rosuvastatin (CRESTOR) 20 MG tablet Take 1 tablet (20 mg total) by mouth daily. 90 tablet 3   sildenafil (VIAGRA) 25 MG tablet TAKE 1 TABLET BY MOUTH ONCE DAILY AS NEEDED FOR  ERECTILE  DYSFUNCTION 25 tablet 0   SUMAtriptan (IMITREX) 25 MG tablet TAKE 1 TABLET BY MOUTH EVERY 2 HOURS AS NEEDED FOR  MIGRAINE. MAY  REPEAT  IN  2  HOURS  IF MIGRAINE PERSISTS. (Patient taking differently: TAKE 1 TABLET BY MOUTH EVERY 2 HOURS AS NEEDED FOR  MIGRAINE. MAY   REPEAT  IN  2  HOURS  IF MIGRAINE PERSISTS.) 10 tablet 0   Ubrogepant (UBRELVY) 50 MG TABS Take 1 tab at start of migraine.  May repeat dose in 2 hours if migraine persists. 16 tablet 1   No facility-administered medications prior to visit.     Review of Systems:   Constitutional: No night sweats, fevers, chills, or lassitude. +daytime fatigue (improved), fluctuating weight HEENT: No difficulty swallowing, tooth/dental problems, or sore throat. No sneezing, itching, ear ache, or post nasal drip. +chronic headaches, AM dry mouth, occasional nasal congestion CV:  No chest pain, orthopnea, PND, swelling in lower extremities, anasarca, dizziness, palpitations, syncope Resp: +snoring, shortness of breath with exertion (baseline). No excess mucus or change in color of mucus. No productive or non-productive. No hemoptysis. No wheezing.  No chest wall deformity GI:  No heartburn, indigestion, abdominal pain, nausea, vomiting, diarrhea, change in bowel habits, loss of appetite, bloody stools.  GU: No dysuria, change in color of urine, urgency or frequency.   Skin: No rash, lesions, ulcerations MSK:  No joint pain or swelling.   Neuro: No dizziness or lightheadedness.  Psych: No depression or anxiety. Mood stable.     Physical Exam:  BP 126/78 (BP Location: Right Arm, Patient Position: Sitting, Cuff Size: Large)   Pulse 78   Temp 97.7 F (36.5 C) (Oral)   Ht  (1.88 m)   Wt 227 lb 12.8 oz (103.3 kg)   SpO2 100%   BMI 29.25 kg/m   GEN: Pleasant, interactive, well-appearing; in no acute distress. HEENT:  Normocephalic and atraumatic. PERRLA. Sclera white. Nasal turbinates pink, moist and patent bilaterally. No rhinorrhea present. Oropharynx pink and moist, without exudate or edema. No lesions, ulcerations, or postnasal drip. Mallampati III NECK:  Supple w/ fair ROM. No JVD present. Normal carotid impulses w/o bruits. Thyroid symmetrical with no goiter or nodules palpated. No  lymphadenopathy.   CV: RRR, no m/r/g, no peripheral edema. Pulses intact, +2 bilaterally. No cyanosis, pallor or clubbing. PULMONARY:  Unlabored, regular breathing. Clear bilaterally A&P w/o wheezes/rales/rhonchi. No accessory muscle use.  GI: BS present and normoactive. Soft, non-tender to palpation. No organomegaly or masses detected.  MSK: No erythema, warmth or tenderness. Cap refil <2 sec all extrem. No deformities or joint swelling noted.  Neuro: A/Ox3. No focal deficits noted.   Skin: Warm, no lesions or rashe Psych: Normal affect and behavior. Judgement and thought content appropriate.     Lab Results:  CBC    Component Value Date/Time   WBC 9.1 12/19/2022 0933   RBC 5.61 12/19/2022 0933   HGB 15.0 12/19/2022 0933   HGB 16.0 01/04/2020 1233   HCT 45.3 12/19/2022 0933   HCT 48.0 01/04/2020 1233   PLT 276.0 12/19/2022 0933   PLT 282  01/04/2020 1233   MCV 80.6 12/19/2022 0933   MCV 84 01/04/2020 1233   MCH 27.9 01/04/2020 1233   MCH 27.8 08/09/2018 1244   MCHC 33.2 12/19/2022 0933   RDW 14.5 12/19/2022 0933   RDW 14.3 01/04/2020 1233   LYMPHSABS 2.4 12/19/2022 0933   LYMPHSABS 3.2 (H) 01/04/2020 1233   MONOABS 1.1 (H) 12/19/2022 0933   EOSABS 0.4 12/19/2022 0933   EOSABS 0.4 01/04/2020 1233   BASOSABS 0.1 12/19/2022 0933   BASOSABS 0.1 01/04/2020 1233    BMET    Component Value Date/Time   NA 141 12/19/2022 0933   NA 139 01/04/2020 1233   K 4.3 12/19/2022 0933   CL 105 12/19/2022 0933   CO2 27 12/19/2022 0933   GLUCOSE 74 12/19/2022 0933   BUN 16 12/19/2022 0933   BUN 11 01/04/2020 1233   CREATININE 0.94 12/19/2022 0933   CREATININE 0.89 02/17/2013 1633   CALCIUM 9.5 12/19/2022 0933   GFRNONAA 92 01/04/2020 1233   GFRAA 106 01/04/2020 1233    BNP    Component Value Date/Time   BNP 639.7 (H) 08/09/2018 1244     Imaging:  No results found.        No data to display          No results found for: "NITRICOXIDE"      Assessment &  Plan:   Mild obstructive sleep apnea Mild OSA with AHI 6/h and minimal O2 desaturations with SpO2 low 88%. His nighttime sleep and daytime symptoms are overall improved. Given the mild severity, minimal associated health risks. Discussed potential treatment options. He would like to move forward with positional sleeping and working on weight loss measures. He will let us know if symptoms do not continue to improve or worsen. Cautioned on safe driving practices.  Patient Instructions  We discussed how untreated sleep apnea puts an individual at risk for cardiac arrhthymias, pulm HTN, DM, stroke and increases their risk for daytime accidents. We also briefly reviewed treatment options including weight loss, side sleeping position, oral appliance, CPAP therapy or referral to ENT for possible surgical options  Since you have such mild severity of your sleep apnea, which does not pose significant health risks, plan is to work on weight loss measures and sleep on your side at night. You can place a tennis ball behind your back to help keep you on your side.  I am glad you are sleeping better. Please call me if anything changes and you feel like you're sleep is worse or you're getting more tired during the day.  Referral to lung cancer screening program  Follow up in 1 year with Dr. Wynona Neat or Florentina Addison Czarina Gingras,NP to see how things are going, or sooner, if needed    Former smoker 20 pack year history. Quit 10/2022. Referral to lung cancer screening program with pt's approval placed today.   I spent 35 minutes of dedicated to the care of this patient on the date of this encounter to include pre-visit review of records, face-to-face time with the patient discussing conditions above, post visit ordering of testing, clinical documentation with the electronic health record, making appropriate referrals as documented, and communicating necessary findings to members of the patients care team.  Noemi Chapel,  NP 02/06/2023  Pt aware and understands NP's role.

## 2023-02-06 NOTE — Patient Instructions (Signed)
We discussed how untreated sleep apnea puts an individual at risk for cardiac arrhthymias, pulm HTN, DM, stroke and increases their risk for daytime accidents. We also briefly reviewed treatment options including weight loss, side sleeping position, oral appliance, CPAP therapy or referral to ENT for possible surgical options  Since you have such mild severity of your sleep apnea, which does not pose significant health risks, plan is to work on weight loss measures and sleep on your side at night. You can place a tennis ball behind your back to help keep you on your side.  I am glad you are sleeping better. Please call me if anything changes and you feel like you're sleep is worse or you're getting more tired during the day.  Referral to lung cancer screening program  Follow up in 1 year with Dr. Wynona Neat or Florentina Addison Aleisa Howk,NP to see how things are going, or sooner, if needed

## 2023-02-06 NOTE — Assessment & Plan Note (Signed)
Mild OSA with AHI 6/h and minimal O2 desaturations with SpO2 low 88%. His nighttime sleep and daytime symptoms are overall improved. Given the mild severity, minimal associated health risks. Discussed potential treatment options. He would like to move forward with positional sleeping and working on weight loss measures. He will let us know if symptoms do not continue to improve or worsen. Cautioned on safe driving practices.  Patient Instructions  We discussed how untreated sleep apnea puts an individual at risk for cardiac arrhthymias, pulm HTN, DM, stroke and increases their risk for daytime accidents. We also briefly reviewed treatment options including weight loss, side sleeping position, oral appliance, CPAP therapy or referral to ENT for possible surgical options  Since you have such mild severity of your sleep apnea, which does not pose significant health risks, plan is to work on weight loss measures and sleep on your side at night. You can place a tennis ball behind your back to help keep you on your side.  I am glad you are sleeping better. Please call me if anything changes and you feel like you're sleep is worse or you're getting more tired during the day.  Referral to lung cancer screening program  Follow up in 1 year with Dr. Wynona Neat or Florentina Addison Sharalee Witman,NP to see how things are going, or sooner, if needed

## 2023-02-06 NOTE — Assessment & Plan Note (Signed)
20 pack year history. Quit 10/2022. Referral to lung cancer screening program with pt's approval placed today.

## 2023-02-10 NOTE — Progress Notes (Unsigned)
Cardiology Office Note:    Date:  02/11/2023   ID:  Curtis Patton, DOB 1965-11-23, MRN [id number]  PCP:  Deeann Saint, MD [facility] Cardiologist: Reatha Harps, MD   Reason for visit: 56-month follow-up  History of Present Illness:    Curtis Patton is a 57 y.o. male with a hx of PAD (R SFA CTO s/p atherectomy/angioplasty), HFpEF, HTN, HLD, former tobacco abuse.    He last Dr. Allyson Sabal December 2023 who follows his PAD.  He developed mild recurrent right calf claudication with Dopplers showing reocclusion of the mid right SFA.  He was started on empiric Pletal 50 mg twice daily.  He is here to reassess claudication burden & and to weigh risk/benefits of reintervention.  Today, patient states he stopped smoking cigars in the past year.  He states Pletal did help a little.  He complains of discomfort in his right shin and feet as well as kneecaps.  States he has a little discomfort in his left leg.  He states it bothers him mostly before he gets started with his day.  He works as a Arboriculturist at [employer].  He is able to work and go through stores without limiting claudication.  He denies wounds and discoloration.  Patient states a couple weeks ago when he had an episode of chest discomfort radiating to his back.  He also mentions shortness of breath with exertion he has noticed more over the past year.  He denies change in stamina.  He has occasional palpitations that recently started, may be 3 times per week with occasionally associated lightheadedness.  He denies LE edema, orthopnea and PND.  He denies history of bleeding issues.  He does have a contrast dye allergy including hives/syncope.  He states he is taking the prednisone protocol before and tolerated testing okay.    Past Medical History:  Diagnosis Date   (HFpEF) heart failure with preserved ejection fraction    a. Echo 08/10/2018: LVEF 50-55% w/ mild LVH, mild diffuse hypokinesis w/ no regional  variation, and G1DD   Acute CHF    Acute renal insufficiency    Acute respiratory failure with hypoxia    Hypertension    Migraine     Past Surgical History:  Procedure Laterality Date   ABDOMINAL AORTOGRAM W/LOWER EXTREMITY N/A 01/12/2020   Procedure: ABDOMINAL AORTOGRAM W/LOWER EXTREMITY;  Surgeon: Runell Gess, MD;  Location: [facility] INVASIVE CV LAB;  Service: Cardiovascular;  Laterality: N/A;   JOINT REPLACEMENT     neck     PERIPHERAL VASCULAR ATHERECTOMY  01/12/2020   Procedure: PERIPHERAL VASCULAR ATHERECTOMY;  Surgeon: Runell Gess, MD;  Location: [facility] INVASIVE CV LAB;  Service: Cardiovascular;;  RT SFA w/DCB    ROTATOR CUFF REPAIR     SPINE SURGERY      Current Medications: Current Meds  Medication Sig   amLODipine (NORVASC) 10 MG tablet Take 1 tablet (10 mg total) by mouth daily.   aspirin EC 81 MG tablet Take 1 tablet (81 mg total) by mouth daily. Swallow whole.   cilostazol (PLETAL) 100 MG tablet Take 1 tablet (100 mg total) by mouth 2 (two) times daily.   diphenhydrAMINE (BENADRYL) 50 MG tablet Take 1 tablet (50 mg total) by mouth once for 1 dose.   famotidine (PEPCID) 20 MG tablet Take 1 tablet (20 mg total) by mouth once for 1 dose. As Directed   Menthol, Topical Analgesic, (ICY HOT EX) Apply 1 application  topically  4 (four) times daily as needed (pain.).   metoprolol tartrate (LOPRESSOR) 100 MG tablet Take 1 tablet (100 mg total) by mouth once for 1 dose. Take 1:30 to 2:00 prior To Scan   predniSONE (DELTASONE) 50 MG tablet Take As Directed   rosuvastatin (CRESTOR) 40 MG tablet Take 1 tablet (40 mg total) by mouth daily.   sildenafil (VIAGRA) 25 MG tablet TAKE 1 TABLET BY MOUTH ONCE DAILY AS NEEDED FOR  ERECTILE  DYSFUNCTION   SUMAtriptan (IMITREX) 25 MG tablet TAKE 1 TABLET BY MOUTH EVERY 2 HOURS AS NEEDED FOR  MIGRAINE. MAY  REPEAT  IN  2  HOURS  IF MIGRAINE PERSISTS. (Patient taking differently: TAKE 1 TABLET BY MOUTH EVERY 2 HOURS AS NEEDED FOR  MIGRAINE. MAY   REPEAT  IN  2  HOURS  IF MIGRAINE PERSISTS.)   Ubrogepant (UBRELVY) 50 MG TABS Take 1 tab at start of migraine.  May repeat dose in 2 hours if migraine persists.   [DISCONTINUED] cilostazol (PLETAL) 50 MG tablet Take 50 mg by mouth 2 (two) times daily.   [DISCONTINUED] rosuvastatin (CRESTOR) 20 MG tablet Take 1 tablet (20 mg total) by mouth daily.     Allergies:   Contrast media [iodinated contrast media] and Iodine   Social History   Socioeconomic History   Marital status: Married    Spouse name: Not on file   Number of children: Not on file   Years of education: Not on file   Highest education level: Not on file  Occupational History   Not on file  Tobacco Use   Smoking status: Former    Packs/day: 1.00    Years: 20.00    Additional pack years: 0.00    Total pack years: 20.00    Types: Cigars, Cigarettes    Quit date: 10/21/2022    Years since quitting: 0.3   Smokeless tobacco: Never  Vaping Use   Vaping Use: Never used  Substance and Sexual Activity   Alcohol use: Yes    Alcohol/week: 6.0 standard drinks of alcohol    Types: 4 Cans of beer, 2 Shots of liquor per week   Drug use: No   Sexual activity: Not Currently    Partners: Female  Other Topics Concern   Not on file  Social History Narrative   Not on file   Social Determinants of Health   Financial Resource Strain: Not on file  Food Insecurity: Not on file  Transportation Needs: Not on file  Physical Activity: Not on file  Stress: Not on file  Social Connections: Not on file     Family History: The patient's family history includes Hypertension in his maternal grandmother, mother, and another family member; Sudden death in his paternal grandmother. There is no history of Colon cancer, Colon polyps, Esophageal cancer, Rectal cancer, or Stomach cancer.  ROS:   Please see the history of present illness.     EKGs/Labs/Other Studies Reviewed:    Recent Labs: 12/19/2022: ALT 45; BUN 16; Creatinine, Ser 0.94;  Hemoglobin 15.0; Platelets 276.0; Potassium 4.3; Sodium 141; TSH 0.49   Recent Lipid Panel Lab Results  Component Value Date/Time   CHOL 165 12/19/2022 09:33 AM   CHOL 141 09/18/2020 04:23 PM   TRIG 60.0 12/19/2022 09:33 AM   HDL 65.20 12/19/2022 09:33 AM   HDL 51 09/18/2020 04:23 PM   LDLCALC 88 12/19/2022 09:33 AM   LDLCALC 64 09/18/2020 04:23 PM    Physical Exam:    VS:  BP  130/72   Pulse 85   Ht  (1.88 m)   Wt 230 lb (104.3 kg)   SpO2 99%   BMI 29.53 kg/m    No data found.   Wt Readings from Last 3 Encounters:  02/11/23 230 lb (104.3 kg)  02/06/23 227 lb 12.8 oz (103.3 kg)  12/22/22 227 lb 9.6 oz (103.2 kg)     GEN:  Well nourished, well developed in no acute distress HEENT: Normal NECK: No JVD; No carotid bruits CARDIAC: RRR, no murmurs, rubs, gallops RESPIRATORY:  Clear to auscultation without rales, wheezing or rhonchi  ABDOMEN: Soft, non-tender, non-distended MUSCULOSKELETAL: No edema VASCULAR: Decreased right>left pedal pulses, warm, no significant discoloration, no wounds SKIN: Warm and dry NEUROLOGIC:  Alert and oriented PSYCHIATRIC:  Normal affect     ASSESSMENT AND PLAN   Peripheral artery disease -R SFA disease: post right SFA directional atherectomy followed by drug coated balloon angioplasty 01/12/2020 for a short segment mid right SFA CTO with two-vessel runoff. His claudication resolved after that and is Dopplers normalized. Doppler 08/2022 with decline in R ABI to 0.80.  Recurrent occlusion of his mid right SFA with mild claudication - started on Pletal 09/2022.   -L SFA disease: 90% mid left SFA stenosis with two-vessel runoff in 2021; L ABI normal -Without lifestyle limiting claudication, would favor maximizing medical therapy and continue walking program. -Increase Pletal to 100 mg twice daily. -Increase Crestor to 40 mg daily with goal LDL less than 70. -Add aspirin 81 mg daily. -Congratulated on tobacco cessation.  Monitor intake of  carbs and sugars given prediabetes. -Recommend he monitor his feet and legs for discoloration and wounds. -Follow-up with Dr. Allyson Sabal in the next 2 to 3 months for reevaluation.  Precordial pain Dyspnea on exertion -Given symptoms of precordial pain and dyspnea on exertion and known PAD, recommend coronary artery evaluation.  I do not see history of ischemic testing.  Recommend CTA of the coronaries with contrast allergy protocol as well as  metoprolol tartrate 100 mg prior to testing. -Aspirin & statin therapy.  Hypertension, well-controlled -Continue amlodipine. -Goal BP is <130/80.  Recommend DASH diet (high in vegetables, fruits, low-fat dairy products, whole grains, poultry, fish, and nuts and low in sweets, sugar-sweetened beverages, and red meats), salt restriction and increase physical activity.  Hyperlipidemia with goal LDL less than 70 -Increase Crestor to 40 mg daily.  Check LDL in 2 to 3 months.   -Discussed cholesterol lowering diets - Mediterranean diet, DASH diet, vegetarian diet, low-carbohydrate diet and avoidance of trans fats.  Discussed healthier choice substitutes.  Nuts, high-fiber foods, and fiber supplements may also improve lipids.    Disposition - Follow-up in 2-3 months with Dr. Allyson Sabal.     Medication Adjustments/Labs and Tests Ordered: Current medicines are reviewed at length with the patient today.  Concerns regarding medicines are outlined above.  Orders Placed This Encounter  Procedures   CT CORONARY MORPH W/CTA COR W/SCORE W/CA W/CM &/OR WO/CM   Basic metabolic panel   Meds ordered this encounter  Medications   cilostazol (PLETAL) 100 MG tablet    Sig: Take 1 tablet (100 mg total) by mouth 2 (two) times daily.    Dispense:  180 tablet    Refill:  3   rosuvastatin (CRESTOR) 40 MG tablet    Sig: Take 1 tablet (40 mg total) by mouth daily.    Dispense:  90 tablet    Refill:  3   metoprolol tartrate (LOPRESSOR) 100 MG  tablet    Sig: Take 1 tablet (100 mg  total) by mouth once for 1 dose. Take 1:30 to 2:00 prior To Scan    Dispense:  1 tablet    Refill:  0   predniSONE (DELTASONE) 50 MG tablet    Sig: Take As Directed    Dispense:  3 tablet    Refill:  0   famotidine (PEPCID) 20 MG tablet    Sig: Take 1 tablet (20 mg total) by mouth once for 1 dose. As Directed    Dispense:  1 tablet    Refill:  0   diphenhydrAMINE (BENADRYL) 50 MG tablet    Sig: Take 1 tablet (50 mg total) by mouth once for 1 dose.    Dispense:  1 tablet    Refill:  0   aspirin EC 81 MG tablet    Sig: Take 1 tablet (81 mg total) by mouth daily. Swallow whole.    Dispense:  30 tablet    Refill:  3    Patient Instructions  Medication Instructions:  Start Aspirin 81 mg ( Take 1 Tablet Daily). Increase Pletal to 100 mg ( Take 1 Tablet Twice Daily). Increase Crestor to 40 mg ( Take 1 Tablet Daily). Metoprolol 100 mg ( Take 1 Tablet 1:30-2:00  Prior To Scan). Prednisone  50 (Take As Directed). Benadryl 50 mg ( Take As Directed). Pepcid 20 mg ( Take As Directed). *If you need a refill on your cardiac medications before your next appointment, please call your pharmacy*   Lab Work: BMET Today If you have labs (blood work) drawn today and your tests are completely normal, you will receive your results only by: MyChart Message (if you have MyChart) OR A paper copy in the mail If you have any lab test that is abnormal or we need to change your treatment, we will call you to review the results.   Testing/Procedures:   Your cardiac CT will be scheduled at one of the below locations:   [facility] [address] [place], [place] [address] [phone]  If scheduled at [facility], please arrive at the [facility] and Children's Entrance (Entrance C2) of [facility] 30 minutes prior to test start time. You can use the FREE valet parking offered at entrance C (encouraged to control the heart rate for the test)  Proceed to the [facility]  Radiology Department (first floor) to check-in and test prep.  All radiology patients and guests should use entrance C2 at [facility], accessed from [facility], even though the hospital's physical address listed is [address]    If scheduled at [facility] or [facility], please arrive 15 mins early for check-in and test prep.   Please follow these instructions carefully (unless otherwise directed):  Hold all erectile dysfunction medications at least 3 days (72 hrs) prior to test. (Ie viagra, cialis, sildenafil, tadalafil, etc) We will administer nitroglycerin during this exam.   On the Night Before the Test: Be sure to Drink plenty of water. Do not consume any caffeinated/decaffeinated beverages or chocolate 12 hours prior to your test. Do not take any antihistamines 12 hours prior to your test. If the patient has contrast allergy: Patient will need a prescription for Prednisone and very clear instructions (as follows): Prednisone 50 mg - take 13 hours prior to test Take another Prednisone 50 mg 7 hours prior to test Take another Prednisone 50 mg 1  hour prior to test Take Benadryl 50 mg 1 hour prior to test Patient must complete all four doses of above prophylactic medications. Patient will need a ride after test due to Benadryl.  On the Day of the Test: Drink plenty of water until 1 hour prior to the test. Do not eat any food 1 hour prior to test. You may take your regular medications prior to the test.  Take metoprolol (Lopressor) two hours prior to test. If you take Furosemide/Hydrochlorothiazide/Spironolactone, please HOLD on the morning of the test. FEMALES- please wear underwire-free bra if available, avoid dresses & tight clothing         After the Test: Drink plenty of water. After receiving IV contrast, you may experience a mild flushed feeling. This is normal. On occasion, you may  experience a mild rash up to 24 hours after the test. This is not dangerous. If this occurs, you can take Benadryl 25 mg and increase your fluid intake. If you experience trouble breathing, this can be serious. If it is severe call 911 IMMEDIATELY. If it is mild, please call our office. If you take any of these medications: Glipizide/Metformin, Avandament, Glucavance, please do not take 48 hours after completing test unless otherwise instructed.  We will call to schedule your test 2-4 weeks out understanding that some insurance companies will need an authorization prior to the service being performed.   For non-scheduling related questions, please contact the cardiac imaging nurse navigator should you have any questions/concerns: Rockwell Alexandria, Cardiac Imaging Nurse Navigator Larey Brick, Cardiac Imaging Nurse Navigator [facility] Direct Office Dial: [phone]   For scheduling needs, including cancellations and rescheduling, please call [place], [phone].    Follow-Up: At [facility], you and your health needs are our priority.  As part of our continuing mission to provide you with exceptional heart care, we have created designated Provider Care Teams.  These Care Teams include your primary Cardiologist (physician) and Advanced Practice Providers (APPs -  Physician Assistants and Nurse Practitioners) who all work together to provide you with the care you need, when you need it.  We recommend signing up for the patient portal called "MyChart".  Sign up information is provided on this After Visit Summary.  MyChart is used to connect with patients for Virtual Visits (Telemedicine).  Patients are able to view lab/test results, encounter notes, upcoming appointments, etc.  Non-urgent messages can be sent to your provider as well.   To learn more about what you can do with MyChart, go to [email or web address].    Your next appointment:   3  month(s)  Provider:   Nanetta Batty, MD     Signed, Cannon Kettle, PA-C  02/11/2023 11:45 AM    [facility]

## 2023-02-11 ENCOUNTER — Ambulatory Visit: Payer: BC Managed Care – PPO | Attending: Cardiovascular Disease | Admitting: Physician Assistant

## 2023-02-11 ENCOUNTER — Encounter: Payer: Self-pay | Admitting: Physician Assistant

## 2023-02-11 VITALS — BP 130/72 | HR 85 | Ht 74.0 in | Wt 230.0 lb

## 2023-02-11 DIAGNOSIS — I739 Peripheral vascular disease, unspecified: Secondary | ICD-10-CM

## 2023-02-11 DIAGNOSIS — I10 Essential (primary) hypertension: Secondary | ICD-10-CM | POA: Diagnosis not present

## 2023-02-11 DIAGNOSIS — E782 Mixed hyperlipidemia: Secondary | ICD-10-CM

## 2023-02-11 DIAGNOSIS — R072 Precordial pain: Secondary | ICD-10-CM

## 2023-02-11 MED ORDER — ASPIRIN 81 MG PO TBEC: 81.0000 mg | DELAYED_RELEASE_TABLET | Freq: Every day | ORAL | 3 refills | Status: AC

## 2023-02-11 MED ORDER — METOPROLOL TARTRATE 100 MG PO TABS: 100.0000 mg | ORAL_TABLET | Freq: Once | ORAL | 0 refills | Status: DC

## 2023-02-11 MED ORDER — ROSUVASTATIN CALCIUM 40 MG PO TABS: 40.0000 mg | ORAL_TABLET | Freq: Every day | ORAL | 3 refills | Status: DC

## 2023-02-11 MED ORDER — DIPHENHYDRAMINE HCL 50 MG PO TABS: 50.0000 mg | ORAL_TABLET | Freq: Once | ORAL | 0 refills | Status: DC

## 2023-02-11 MED ORDER — FAMOTIDINE 20 MG PO TABS: 20.0000 mg | ORAL_TABLET | Freq: Once | ORAL | 0 refills | Status: DC

## 2023-02-11 MED ORDER — PREDNISONE 50 MG PO TABS: ORAL_TABLET | ORAL | 0 refills | Status: DC

## 2023-02-11 MED ORDER — CILOSTAZOL 100 MG PO TABS: 100.0000 mg | ORAL_TABLET | Freq: Two times a day (BID) | ORAL | 3 refills | Status: DC

## 2023-02-11 NOTE — Patient Instructions (Addendum)
Medication Instructions:  Start Aspirin 81 mg ( Take 1 Tablet Daily). Increase Pletal to 100 mg ( Take 1 Tablet Twice Daily). Increase Crestor to 40 mg ( Take 1 Tablet Daily). Metoprolol 100 mg ( Take 1 Tablet 1:30-2:00  Prior To Scan). Prednisone  50 (Take As Directed). Benadryl 50 mg ( Take As Directed). Pepcid 20 mg ( Take As Directed). *If you need a refill on your cardiac medications before your next appointment, please call your pharmacy*   Lab Work: BMET Today If you have labs (blood work) drawn today and your tests are completely normal, you will receive your results only by: MyChart Message (if you have MyChart) OR A paper copy in the mail If you have any lab test that is abnormal or we need to change your treatment, we will call you to review the results.   Testing/Procedures:   Your cardiac CT will be scheduled at one of the below locations:   [facility] [address] [place], [place] [address] [phone]  If scheduled at [facility], please arrive at the [facility] and Children's Entrance (Entrance C2) of [facility] 30 minutes prior to test start time. You can use the FREE valet parking offered at entrance C (encouraged to control the heart rate for the test)  Proceed to the [facility] Radiology Department (first floor) to check-in and test prep.  All radiology patients and guests should use entrance C2 at [facility], accessed from [facility], even though the hospital's physical address listed is [address]    If scheduled at [facility] or [facility], please arrive 15 mins early for check-in and test prep.   Please follow these instructions carefully (unless otherwise directed):  Hold all erectile dysfunction medications at least 3 days (72 hrs) prior to test. (Ie viagra, cialis, sildenafil, tadalafil, etc) We will administer nitroglycerin during  this exam.   On the Night Before the Test: Be sure to Drink plenty of water. Do not consume any caffeinated/decaffeinated beverages or chocolate 12 hours prior to your test. Do not take any antihistamines 12 hours prior to your test. If the patient has contrast allergy: Patient will need a prescription for Prednisone and very clear instructions (as follows): Prednisone 50 mg - take 13 hours prior to test Take another Prednisone 50 mg 7 hours prior to test Take another Prednisone 50 mg 1 hour prior to test Take Benadryl 50 mg 1 hour prior to test Patient must complete all four doses of above prophylactic medications. Patient will need a ride after test due to Benadryl.  On the Day of the Test: Drink plenty of water until 1 hour prior to the test. Do not eat any food 1 hour prior to test. You may take your regular medications prior to the test.  Take metoprolol (Lopressor) two hours prior to test. If you take Furosemide/Hydrochlorothiazide/Spironolactone, please HOLD on the morning of the test. FEMALES- please wear underwire-free bra if available, avoid dresses & tight clothing         After the Test: Drink plenty of water. After receiving IV contrast, you may experience a mild flushed feeling. This is normal. On occasion, you may experience a mild rash up to 24 hours after the test. This is not dangerous. If this occurs, you can take Benadryl 25 mg and increase your fluid intake. If you experience trouble breathing, this can be serious. If it is severe call  911 IMMEDIATELY. If it is mild, please call our office. If you take any of these medications: Glipizide/Metformin, Avandament, Glucavance, please do not take 48 hours after completing test unless otherwise instructed.  We will call to schedule your test 2-4 weeks out understanding that some insurance companies will need an authorization prior to the service being performed.   For non-scheduling related questions, please contact  the cardiac imaging nurse navigator should you have any questions/concerns: Rockwell Alexandria, Cardiac Imaging Nurse Navigator Larey Brick, Cardiac Imaging Nurse Navigator [place] [facility] Direct Office Dial: [phone]   For scheduling needs, including cancellations and rescheduling, please call [place], [phone].    Follow-Up: At [facility], you and your health needs are our priority.  As part of our continuing mission to provide you with exceptional heart care, we have created designated Provider Care Teams.  These Care Teams include your primary Cardiologist (physician) and Advanced Practice Providers (APPs -  Physician Assistants and Nurse Practitioners) who all work together to provide you with the care you need, when you need it.  We recommend signing up for the patient portal called "MyChart".  Sign up information is provided on this After Visit Summary.  MyChart is used to connect with patients for Virtual Visits (Telemedicine).  Patients are able to view lab/test results, encounter notes, upcoming appointments, etc.  Non-urgent messages can be sent to your provider as well.   To learn more about what you can do with MyChart, go to [email or web address].    Your next appointment:   3 month(s)  Provider:   Nanetta Batty, MD

## 2023-02-12 LAB — BASIC METABOLIC PANEL
BUN/Creatinine Ratio: 9 (ref 9–20)
BUN: 10 mg/dL (ref 6–24)
CO2: 22 mmol/L (ref 20–29)
Calcium: 9.7 mg/dL (ref 8.7–10.2)
Chloride: 105 mmol/L (ref 96–106)
Creatinine, Ser: 1.08 mg/dL (ref 0.76–1.27)
Glucose: 88 mg/dL (ref 70–99)
Potassium: 4.5 mmol/L (ref 3.5–5.2)
Sodium: 143 mmol/L (ref 134–144)
eGFR: 80 mL/min/{1.73_m2} (ref >59)

## 2023-02-17 ENCOUNTER — Telehealth (HOSPITAL_COMMUNITY): Payer: Self-pay | Admitting: *Deleted

## 2023-02-17 NOTE — Telephone Encounter (Signed)
Reaching out to patient to offer assistance regarding upcoming cardiac imaging study; pt verbalizes understanding of appt date/time, parking situation and where to check in, pre-test NPO status and medications ordered, and verified current allergies; name and call back number provided for further questions should they arise  Larey Brick RN Navigator Cardiac Imaging [facility] [phone] office [phone] cell  Reviewed how to take 13 hour prep and metoprolol with patient. He understands that he is to take meds at 8pm, 2am, 7am, and 8am, and to arrive at 8:30am.

## 2023-02-18 ENCOUNTER — Telehealth: Payer: Self-pay

## 2023-02-18 ENCOUNTER — Ambulatory Visit (HOSPITAL_COMMUNITY)
Admission: RE | Admit: 2023-02-18 | Discharge: 2023-02-18 | Disposition: A | Discharge status: Home / Self Care | Payer: BC Managed Care – PPO | Source: Ambulatory Visit | Attending: Physician Assistant | Admitting: Physician Assistant

## 2023-02-18 DIAGNOSIS — R072 Precordial pain: Secondary | ICD-10-CM | POA: Diagnosis present

## 2023-02-18 MED ORDER — DILTIAZEM HCL 25 MG/5ML IV SOLN
10.0000 mg | Freq: Once | INTRAVENOUS | Status: AC
  Administered 2023-02-18: 10 mg via INTRAVENOUS

## 2023-02-18 MED ORDER — METOPROLOL TARTRATE 5 MG/5ML IV SOLN
10.0000 mg | Freq: Once | INTRAVENOUS | Status: AC
  Administered 2023-02-18: 10 mg via INTRAVENOUS

## 2023-02-18 MED ORDER — NITROGLYCERIN 0.4 MG SL SUBL
0.8000 mg | SUBLINGUAL_TABLET | Freq: Once | SUBLINGUAL | Status: AC
  Administered 2023-02-18: 0.8 mg via SUBLINGUAL

## 2023-02-18 MED ORDER — IOHEXOL 350 MG/ML SOLN
100.0000 mL | Freq: Once | INTRAVENOUS | Status: AC | PRN
  Administered 2023-02-18: 100 mL via INTRAVENOUS

## 2023-02-18 MED ORDER — DILTIAZEM HCL 25 MG/5ML IV SOLN
INTRAVENOUS | Status: AC
  Filled 2023-02-18: qty 5

## 2023-02-18 MED ORDER — NITROGLYCERIN 0.4 MG SL SUBL
SUBLINGUAL_TABLET | SUBLINGUAL | Status: AC
  Filled 2023-02-18: qty 2

## 2023-02-18 MED ORDER — METOPROLOL TARTRATE 5 MG/5ML IV SOLN
INTRAVENOUS | Status: AC
  Filled 2023-02-18: qty 20

## 2023-02-18 NOTE — Telephone Encounter (Addendum)
Called patient regarding results-. Patient had understanding of results.---- Message from Cannon Kettle, PA-C sent at 02/17/2023  9:38 AM EDT ----- Normal kidney function and potassium.

## 2023-02-18 NOTE — Telephone Encounter (Addendum)
Called patient regarding results-. Patient had understanding of results.---- Message from Jennifer K Lambert, PA-C sent at 02/17/2023  9:38 AM EDT ----- Normal kidney function and potassium. 

## 2023-03-05 ENCOUNTER — Telehealth: Payer: Self-pay

## 2023-03-05 NOTE — Telephone Encounter (Addendum)
Called patient regarding results. Patient had understanding of results.----- Message from Cannon Kettle, PA-C sent at 03/03/2023 12:47 PM EDT ----- Mild plaque in the LAD, mild plaque in the left circumflex artery. No significant plaque in the left main artery or right coronary artery.  No significant blockages that would cause chest discomfort or shortness of breath. Recommendations --Continue tobacco avoidance.  Continue aspirin and statin therapy.

## 2023-03-31 NOTE — Progress Notes (Deleted)
NEUROLOGY CONSULTATION NOTE  Curtis Patton MRN: [id number] DOB: Mar 05, 1966  Referring provider: Abbe Amsterdam, MD Primary care provider: Abbe Amsterdam, MD  Reason for consult:  headache  Assessment/Plan:   ***   Subjective:  Curtis Patton is a 57 year old male with CHF, PAD and HTN who presents for headache.  History supplemented by referring provider's note.  Onset:  *** Location:  *** Quality:  *** Intensity:  ***.   Aura:  *** Prodrome:  *** Associated symptoms:  ***.  He denies associated unilateral numbness or weakness. Duration:  *** Frequency:  *** Frequency of abortive medication: *** Triggers:  *** Relieving factors:  *** Activity:  ***  Past NSAIDS/analgesics:  naproxen Past abortive triptans:  *** Past abortive ergotamine:  none Past muscle relaxants:  none Past anti-emetic:  *** Past antihypertensive medications:  propranolol, metoprolol, carvedilol, furosemide, lisinopril-HCTZ, losartan Past antidepressant medications:  *** Past anticonvulsant medications:  *** Past anti-CGRP:  none Past vitamins/Herbal/Supplements:  none Past antihistamines/decongestants:  Benadryl Other past therapies:  none  Current NSAIDS/analgesics:  ASA 81mg  daily Current triptans:  sumatriptan 25mg  Current ergotamine:  none Current anti-emetic:  none Current muscle relaxants:  none Current Antihypertensive medications:  amlodipine Current Antidepressant medications:  none Current Anticonvulsant medications:  none Current anti-CGRP:  Ubrelvy 50mg  Current Vitamins/Herbal/Supplements:  none Current Antihistamines/Decongestants:  none Other therapy:  *** Other medications:  cilostazol, sildenafil, rosuvastatin  Caffeine:  *** Alcohol:  *** Smoker:  *** Diet:  *** Exercise:  *** Depression:  ***; Anxiety:  *** Other pain:  *** Sleep hygiene:  *** Family history of headache:  ***      PAST MEDICAL HISTORY: Past Medical History:  Diagnosis Date    (HFpEF) heart failure with preserved ejection fraction (HCC)    a. Echo 08/10/2018: LVEF 50-55% w/ mild LVH, mild diffuse hypokinesis w/ no regional variation, and G1DD   Acute CHF (HCC)    Acute renal insufficiency    Acute respiratory failure with hypoxia (HCC)    Hypertension    Migraine     PAST SURGICAL HISTORY: Past Surgical History:  Procedure Laterality Date   ABDOMINAL AORTOGRAM W/LOWER EXTREMITY N/A 01/12/2020   Procedure: ABDOMINAL AORTOGRAM W/LOWER EXTREMITY;  Surgeon: Runell Gess, MD;  Location: [facility] IN[facility]ASIVE CV [facility];  Service: Cardiovascular;  Laterality: N/A;   JOINT REPLACEMENT     neck     PERIPHERAL VASCULAR ATHERECTOMY  01/12/2020   Procedure: PERIPHERAL VASCULAR ATHERECTOMY;  Surgeon: Runell Gess, MD;  Location: [facility] INVASIVE CV LAB;  Service: Cardiovascular;;  RT SFA w/DCB    ROTATOR CUFF REPAIR     SPINE SURGERY      MEDICATIONS: Current Outpatient Medications on File Prior to Visit  Medication Sig Dispense Refill   amLODipine (NORVASC) 10 MG tablet Take 1 tablet (10 mg total) by mouth daily. 90 tablet 3   aspirin EC 81 MG tablet Take 1 tablet (81 mg total) by mouth daily. Swallow whole. 30 tablet 3   cilostazol (PLETAL) 100 MG tablet Take 1 tablet (100 mg total) by mouth 2 (two) times daily. 180 tablet 3   diphenhydrAMINE (BENADRYL) 50 MG tablet Take 1 tablet (50 mg total) by mouth once for 1 dose. 1 tablet 0   famotidine (PEPCID) 20 MG tablet Take 1 tablet (20 mg total) by mouth once for 1 dose. As Directed 1 tablet 0   Menthol, Topical Analgesic, (ICY HOT EX) Apply 1 application  topically 4 (four) times daily as needed (pain.).  metoprolol tartrate (LOPRESSOR) 100 MG tablet Take 1 tablet (100 mg total) by mouth once for 1 dose. Take 1:30 to 2:00 prior To Scan 1 tablet 0   predniSONE (DELTASONE) 50 MG tablet Take As Directed 3 tablet 0   rosuvastatin (CRESTOR) 40 MG tablet Take 1 tablet (40 mg total) by mouth daily. 90 tablet 3   sildenafil (VIAGRA)  25 MG tablet TAKE 1 TABLET BY MOUTH ONCE DAILY AS NEEDED FOR  ERECTILE  DYSFUNCTION 25 tablet 0   SUMAtriptan (IMITREX) 25 MG tablet TAKE 1 TABLET BY MOUTH EVERY 2 HOURS AS NEEDED FOR  MIGRAINE. MAY  REPEAT  IN  2  HOURS  IF MIGRAINE PERSISTS. (Patient taking differently: TAKE 1 TABLET BY MOUTH EVERY 2 HOURS AS NEEDED FOR  MIGRAINE. MAY  REPEAT  IN  2  HOURS  IF MIGRAINE PERSISTS.) 10 tablet 0   Ubrogepant (UBRELVY) 50 MG TABS Take 1 tab at start of migraine.  May repeat dose in 2 hours if migraine persists. 16 tablet 1   No current facility-administered medications on file prior to visit.    ALLERGIES: Allergies  Allergen Reactions   Contrast Media [Iodinated Contrast Media] Hives   Iodine Hives    FAMILY HISTORY: Family History  Problem Relation Age of Onset   Hypertension Other    Hypertension Maternal Grandmother    Sudden death Paternal Grandmother        Died suddenly in her mid-50s. Unsure of cause.   Hypertension Mother    Colon cancer Neg Hx    Colon polyps Neg Hx    Esophageal cancer Neg Hx    Rectal cancer Neg Hx    Stomach cancer Neg Hx     Objective:  *** General: No acute distress.  Patient appears well-groomed.   Head:  Normocephalic/atraumatic Eyes:  fundi examined but not visualized Neck: supple, no paraspinal tenderness, full range of motion Heart: regular rate and rhythm Neurological Exam: Mental status: alert and oriented to person, place, and time, speech fluent and not dysarthric, language intact. Cranial nerves: CN I: not tested CN II: pupils equal, round and reactive to light, visual fields intact CN III, IV, VI:  full range of motion, no nystagmus, no ptosis CN V: facial sensation intact. CN VII: upper and lower face symmetric CN VIII: hearing intact CN IX, X: gag intact, uvula midline CN XI: sternocleidomastoid and trapezius muscles intact CN XII: tongue midline Bulk & Tone: normal, no fasciculations. Motor:  muscle strength 5/5  throughout Sensation:  Pinprick, temperature and vibratory sensation intact. Deep Tendon Reflexes:  2+ throughout,  toes downgoing.   Finger to nose testing:  Without dysmetria.   Heel to shin:  Without dysmetria.   Gait:  Normal station and stride.  Romberg negative.    Thank you for allowing me to take part in the care of this patient.  Shon Millet, DO  CC: Abbe Amsterdam, MD

## 2023-04-01 ENCOUNTER — Ambulatory Visit: Payer: BC Managed Care – PPO | Admitting: Neurology

## 2023-04-01 ENCOUNTER — Encounter: Payer: Self-pay | Admitting: Neurology

## 2023-04-01 DIAGNOSIS — Z029 Encounter for administrative examinations, unspecified: Secondary | ICD-10-CM

## 2023-04-06 ENCOUNTER — Encounter: Payer: Self-pay | Admitting: *Deleted

## 2023-06-10 ENCOUNTER — Ambulatory Visit: Payer: BC Managed Care – PPO | Attending: Cardiovascular Disease | Admitting: Cardiovascular Disease

## 2023-11-12 ENCOUNTER — Ambulatory Visit: Payer: Self-pay | Admitting: Family Medicine

## 2023-11-12 ENCOUNTER — Encounter: Payer: Self-pay | Admitting: Family Medicine

## 2023-11-12 VITALS — BP 132/70 | HR 77 | Temp 98.5°F | Ht 74.0 in | Wt 223.8 lb

## 2023-11-12 DIAGNOSIS — Z125 Encounter for screening for malignant neoplasm of prostate: Secondary | ICD-10-CM | POA: Diagnosis not present

## 2023-11-12 DIAGNOSIS — Z1159 Encounter for screening for other viral diseases: Secondary | ICD-10-CM

## 2023-11-12 DIAGNOSIS — Z0000 Encounter for general adult medical examination without abnormal findings: Secondary | ICD-10-CM | POA: Diagnosis not present

## 2023-11-12 DIAGNOSIS — Z23 Encounter for immunization: Secondary | ICD-10-CM

## 2023-11-12 DIAGNOSIS — I10 Essential (primary) hypertension: Secondary | ICD-10-CM

## 2023-11-12 DIAGNOSIS — R7303 Prediabetes: Secondary | ICD-10-CM | POA: Diagnosis not present

## 2023-11-12 DIAGNOSIS — K429 Umbilical hernia without obstruction or gangrene: Secondary | ICD-10-CM | POA: Diagnosis not present

## 2023-11-12 DIAGNOSIS — I739 Peripheral vascular disease, unspecified: Secondary | ICD-10-CM | POA: Diagnosis not present

## 2023-11-12 DIAGNOSIS — E782 Mixed hyperlipidemia: Secondary | ICD-10-CM

## 2023-11-12 LAB — CBC WITH DIFFERENTIAL/PLATELET
Basophils Absolute: 0 10*3/uL (ref 0.0–0.1)
Basophils Relative: 0.5 % (ref 0.0–3.0)
Eosinophils Absolute: 0.3 10*3/uL (ref 0.0–0.7)
Eosinophils Relative: 3.6 % (ref 0.0–5.0)
HCT: 47.3 % (ref 39.0–52.0)
Hemoglobin: 15.5 g/dL (ref 13.0–17.0)
Lymphocytes Relative: 27.9 % (ref 12.0–46.0)
Lymphs Abs: 2.3 10*3/uL (ref 0.7–4.0)
MCHC: 32.9 g/dL (ref 30.0–36.0)
MCV: 81.9 fL (ref 78.0–100.0)
Monocytes Absolute: 1 10*3/uL (ref 0.1–1.0)
Monocytes Relative: 11.5 % (ref 3.0–12.0)
Neutro Abs: 4.7 10*3/uL (ref 1.4–7.7)
Neutrophils Relative %: 56.5 % (ref 43.0–77.0)
Platelets: 266 10*3/uL (ref 150.0–400.0)
RBC: 5.78 Mil/uL (ref 4.22–5.81)
RDW: 14.5 % (ref 11.5–15.5)
WBC: 8.2 10*3/uL (ref 4.0–10.5)

## 2023-11-12 LAB — LIPID PANEL
Cholesterol: 119 mg/dL (ref 0–200)
HDL: 46.6 mg/dL (ref >39.00)
LDL Cholesterol: 58 mg/dL (ref 0–99)
NonHDL: 72.89
Total CHOL/HDL Ratio: 3
Triglycerides: 75 mg/dL (ref 0.0–149.0)
VLDL: 15 mg/dL (ref 0.0–40.0)

## 2023-11-12 LAB — PSA: PSA: 0.48 ng/mL (ref 0.10–4.00)

## 2023-11-12 LAB — COMPREHENSIVE METABOLIC PANEL
ALT: 21 U/L (ref 0–53)
AST: 19 U/L (ref 0–37)
Albumin: 4.7 g/dL (ref 3.5–5.2)
Alkaline Phosphatase: 77 U/L (ref 39–117)
BUN: 8 mg/dL (ref 6–23)
CO2: 29 meq/L (ref 19–32)
Calcium: 9.5 mg/dL (ref 8.4–10.5)
Chloride: 106 meq/L (ref 96–112)
Creatinine, Ser: 0.92 mg/dL (ref 0.40–1.50)
GFR: 91.99 mL/min (ref >60.00)
Glucose, Bld: 90 mg/dL (ref 70–99)
Potassium: 4 meq/L (ref 3.5–5.1)
Sodium: 142 meq/L (ref 135–145)
Total Bilirubin: 0.8 mg/dL (ref 0.2–1.2)
Total Protein: 7.3 g/dL (ref 6.0–8.3)

## 2023-11-12 LAB — TSH: TSH: 0.19 u[IU]/mL — ABNORMAL LOW (ref 0.35–5.50)

## 2023-11-12 LAB — T4, FREE: Free T4: 1.06 ng/dL (ref 0.60–1.60)

## 2023-11-12 LAB — HEMOGLOBIN A1C: Hgb A1c MFr Bld: 6 % (ref 4.6–6.5)

## 2023-11-12 NOTE — Patient Instructions (Signed)
Referral to general surgery was placed.  You should expect a phone call about setting up this appointment.  I have placed some information about umbilical hernias on this after visit summary for you to review.  Remember if you develop pain at the site, etc. proceed to nearest ED.

## 2023-11-12 NOTE — Progress Notes (Unsigned)
Established Patient Office Visit   Subjective  Patient ID: Curtis Patton, male    DOB: 12/18/1965  Age: 58 y.o. MRN: [id number]  Chief Complaint  Patient presents with   Annual Exam   Abdominal Pain    Hernia started hurting 6 months ago      Pt is a 58 yo male seen for CPE and ongoing concern.  Pt endorses a bulge in abd x several months.  States area may have increased in size.  Has pain if bumps into something or with lifting.  Does not recall injury at time of initial appearance of bulge.    Patient Active Problem List   Diagnosis Date Noted   Mild obstructive sleep apnea 02/06/2023   Former smoker 02/06/2023   Snoring 12/22/2022   Restless sleeper 12/22/2022   Peripheral arterial disease (HCC) 01/04/2020   Acute respiratory failure with hypoxia (HCC) 08/10/2018   Acute on chronic diastolic CHF (congestive heart failure) (HCC) 08/10/2018   Acute diastolic CHF (congestive heart failure), NYHA class 3 (HCC)    Hypertensive crisis    Acute CHF (congestive heart failure) (HCC) 08/09/2018   Hypertension 08/09/2018   Hypertensive urgency 08/09/2018   Mild renal insufficiency 08/09/2018   Acute CHF (HCC) 08/09/2018   Pilar cyst 04/12/2013   Past Medical History:  Diagnosis Date   (HFpEF) heart failure with preserved ejection fraction (HCC)    a. Echo 08/10/2018: LVEF 50-55% w/ mild LVH, mild diffuse hypokinesis w/ no regional variation, and G1DD   Acute CHF (HCC)    Acute renal insufficiency    Acute respiratory failure with hypoxia (HCC)    Hypertension    Migraine    Past Surgical History:  Procedure Laterality Date   ABDOMINAL AORTOGRAM W/LOWER EXTREMITY N/A 01/12/2020   Procedure: ABDOMINAL AORTOGRAM W/LOWER EXTREMITY;  Surgeon: Runell Gess, MD;  Location: [facility] INVASIVE CV [facility];  Service: Cardiovascular;  Laterality: N/A;   JOINT REPLACEMENT     neck     PERIPHERAL VASCULAR ATHERECTOMY  01/12/2020   Procedure: PERIPHERAL VASCULAR ATHERECTOMY;  Surgeon:  Runell Gess, MD;  Location[facility] INVASIVE CV LAB;  Service: Cardiovascular;;  RT SFA w/DCB    ROTATOR CUFF REPAIR     SPINE SURGERY     Social History   Tobacco Use   Smoking status: Former    Current packs/day: 0.00    Average packs/day: 1 pack/day for 20.0 years (20.0 ttl pk-yrs)    Types: Cigars, Cigarettes    Start date: 10/21/2002    Quit date: 10/21/2022    Years since quitting: 1.0   Smokeless tobacco: Never  Vaping Use   Vaping status: Never Used  Substance Use Topics   Alcohol use: Yes    Alcohol/week: 6.0 standard drinks of alcohol    Types: 4 Cans of beer, 2 Shots of liquor per week   Drug use: No   Family History  Problem Relation Age of Onset   Hypertension Other    Hypertension Maternal Grandmother    Sudden death Paternal Grandmother        Died suddenly in her mid-50s. Unsure of cause.   Hypertension Mother    Colon cancer Neg Hx    Colon polyps Neg Hx    Esophageal cancer Neg Hx    Rectal cancer Neg Hx    Stomach cancer Neg Hx    Allergies  Allergen Reactions   Contrast Media [Iodinated Contrast Media] Hives   Iodine Hives  ROS Negative unless stated above    Objective:     BP 132/70 (BP Location: Left Arm, Patient Position: Sitting, Cuff Size: Large)   Pulse 77   Temp 98.5 F (36.9 C) (Oral)   Ht 6\' 2"  (1.88 m)   Wt 223 lb 12.8 oz (101.5 kg)   SpO2 96%   BMI 28.73 kg/m  BP Readings from Last 3 Encounters:  11/12/23 132/70  02/18/23 133/87  02/11/23 130/72   Wt Readings from Last 3 Encounters:  11/12/23 223 lb 12.8 oz (101.5 kg)  02/11/23 230 lb (104.3 kg)  02/06/23 227 lb 12.8 oz (103.3 kg)     Physical Exam Constitutional:      Appearance: Normal appearance.  HENT:     Head: Normocephalic and atraumatic.     Right Ear: Tympanic membrane, ear canal and external ear normal.     Left Ear: Tympanic membrane, ear canal and external ear normal.     Nose: Nose normal.     Mouth/Throat:     Mouth: Mucous membranes are  moist.     Pharynx: No oropharyngeal exudate or posterior oropharyngeal erythema.  Eyes:     General: No scleral icterus.    Extraocular Movements: Extraocular movements intact.     Conjunctiva/sclera: Conjunctivae normal.     Pupils: Pupils are equal, round, and reactive to light.  Neck:     Thyroid: No thyromegaly.  Cardiovascular:     Rate and Rhythm: Normal rate and regular rhythm.     Pulses: Normal pulses.     Heart sounds: Normal heart sounds. No murmur heard.    No friction rub.  Pulmonary:     Effort: Pulmonary effort is normal.     Breath sounds: Normal breath sounds. No wheezing, rhonchi or rales.  Abdominal:     General: Bowel sounds are normal.     Palpations: Abdomen is soft.     Tenderness: There is no abdominal tenderness.     Hernia: A hernia is present. Hernia is present in the umbilical area.  Musculoskeletal:        General: No deformity. Normal range of motion.  Lymphadenopathy:     Cervical: No cervical adenopathy.  Skin:    General: Skin is warm and dry.     Findings: No lesion.  Neurological:     General: No focal deficit present.     Mental Status: He is alert and oriented to person, place, and time.  Psychiatric:        Mood and Affect: Mood normal.        Thought Content: Thought content normal.      12/19/2022    9:00 AM 11/28/2022    9:14 AM 11/22/2021    2:16 PM  Depression screen PHQ 2/9  Decreased Interest 0 3 0  Down, Depressed, Hopeless 0 0 0  PHQ - 2 Score 0 3 0  Altered sleeping 1 3 2   Tired, decreased energy 0 2 1  Change in appetite 2 3 0  Feeling bad or failure about yourself  0 0 0  Trouble concentrating 0 0 0  Moving slowly or fidgety/restless 1 0 0  Suicidal thoughts 0 0 0  PHQ-9 Score 4 11 3   Difficult doing work/chores Not difficult at all Somewhat difficult       12/19/2022    9:00 AM 11/28/2022    9:17 AM  GAD 7 : Generalized Anxiety Score  Nervous, Anxious, on Edge 0 0  Control/stop worrying 0  0  Worry too much -  different things 1 0  Trouble relaxing 1 1  Restless 0 0  Easily annoyed or irritable 2 3  Afraid - awful might happen 0 0  Total GAD 7 Score 4 4  Anxiety Difficulty  Somewhat difficult     No results found for any visits on 11/12/23.    Assessment & Plan:  Well adult exam -Age-appropriate health screenings discussed. -Obtain labs -Immunizations reviewed.  Patient advised to consider influenza vaccine, pneumonia vaccine, and shingles vaccines.  Influenza vaccine given in clinic. -Colonoscopy done 11/21/2019 -Next CPE in 1 year -     Comprehensive metabolic panel; Future -     Hemoglobin A1c; Future  Umbilical hernia without obstruction and without gangrene -Patient given strict precautions.  Advised to proceed to nearest ED for pain or inability to reduce. -Discussed treatment options. -Referral to general surgery placed. -     Comprehensive metabolic panel; Future -     Ambulatory referral to General Surgery  Essential hypertension -Controlled -Continue current medications including Norvasc 10 mg daily -Continue lifestyle modifications -Patient encouraged to monitor BP at home and keep log to bring to clinic. -     Comprehensive metabolic panel; Future -     TSH; Future -     T4, free; Future  PAD (peripheral artery disease) (HCC) -Continue aspirin 81 mg daily and cilostazol 100 mg 2 times daily -     CBC with Differential/Platelet; Future -     Lipid panel; Future  Mixed hyperlipidemia -Lifestyle modifications encouraged. -Continue statin -     Lipid panel; Future  Need for hepatitis C screening test -     Hepatitis C antibody  Screening for prostate cancer -     PSA; Future  Prediabetes -Hemoglobin A1c 6.0% on 12/19/2022 -Lifestyle modification strongly encouraged -     Hemoglobin A1c; Future  Need for influenza vaccination       -     Flu vaccine trivalent   Return if symptoms worsen or fail to improve.   Deeann Saint, MD

## 2023-11-13 LAB — HEPATITIS C ANTIBODY: Hepatitis C Ab: NONREACTIVE

## 2023-11-17 ENCOUNTER — Other Ambulatory Visit: Payer: Self-pay | Admitting: General Surgery

## 2023-11-17 ENCOUNTER — Ambulatory Visit: Payer: Self-pay | Admitting: General Surgery

## 2023-11-17 ENCOUNTER — Telehealth: Payer: Self-pay | Admitting: Cardiovascular Disease

## 2023-11-17 ENCOUNTER — Encounter: Payer: Self-pay | Admitting: General Surgery

## 2023-11-17 DIAGNOSIS — R160 Hepatomegaly, not elsewhere classified: Secondary | ICD-10-CM

## 2023-11-17 NOTE — Telephone Encounter (Signed)
   Pre-operative Risk Assessment    Patient Name: Curtis Patton  DOB: 11/30/1965 MRN: [id number]   Date of last office visit: 02/11/2023 Date of next office visit: none   Request for Surgical Clearance    Procedure:   hernia repair  Date of Surgery:  Clearance TBD                                Surgeon:  Dr. Chevis Pretty III Surgeon's Group or Practice Name:  [facility] Phone number:  [phone] Fax number:  [phone] Brennan Bailey, CMA   Type of Clearance Requested:   - Medical  - Pharmacy:  Hold Aspirin Need holding instructions   Type of Anesthesia:  General    Additional requests/questions:    Sharen Hones   11/17/2023, 10:53 AM

## 2023-11-18 ENCOUNTER — Ambulatory Visit
Admission: RE | Admit: 2023-11-18 | Discharge: 2023-11-18 | Discharge status: Home / Self Care | Payer: 59 | Source: Ambulatory Visit | Attending: General Surgery | Admitting: General Surgery

## 2023-11-18 DIAGNOSIS — R160 Hepatomegaly, not elsewhere classified: Secondary | ICD-10-CM

## 2023-11-18 MED ORDER — GADOPICLENOL 0.5 MMOL/ML IV SOLN
10.0000 mL | Freq: Once | INTRAVENOUS | Status: AC | PRN
  Administered 2023-11-18: 10 mL via INTRAVENOUS

## 2023-11-18 NOTE — Telephone Encounter (Signed)
   Name: Curtis Patton  DOB: 1966/01/13  MRN: [id number]  Primary Cardiologist: Reatha Harps, MD  Chart reviewed as part of pre-operative protocol coverage. Because of Unknown Schleyer Delucia's past medical history and time since last visit, he will require a follow-up in-office visit in order to better assess preoperative cardiovascular risk. Patient did not complete scheduled visit with Dr. Allyson Sabal in 05/2023, intervention for PVD was to be discussed at that time.   Pre-op covering staff: - Please schedule appointment and call patient to inform them. If patient already had an upcoming appointment within acceptable timeframe, please add "pre-op clearance" to the appointment notes so provider is aware. - Please contact requesting surgeon's office via preferred method (i.e, phone, fax) to inform them of need for appointment prior to surgery.  Rip Harbour, NP  11/18/2023, 8:12 AM

## 2023-11-18 NOTE — Telephone Encounter (Signed)
S/w the pt and offered a number of appt's for pre op clearance. Pt opted to see Bernadene Person, NP tomorrow 9:15. I will update all parties involved.

## 2023-11-19 ENCOUNTER — Ambulatory Visit: Payer: 59 | Admitting: Nurse Practitioner

## 2023-11-20 ENCOUNTER — Other Ambulatory Visit: Payer: Self-pay | Admitting: Family Medicine

## 2023-11-20 ENCOUNTER — Encounter: Payer: Self-pay | Admitting: Physician Assistant

## 2023-11-20 ENCOUNTER — Encounter: Payer: Self-pay | Admitting: Family Medicine

## 2023-11-20 ENCOUNTER — Ambulatory Visit: Payer: 59 | Attending: Physician Assistant | Admitting: Physician Assistant

## 2023-11-20 VITALS — BP 116/78 | HR 81 | Wt 227.2 lb

## 2023-11-20 DIAGNOSIS — K769 Liver disease, unspecified: Secondary | ICD-10-CM | POA: Diagnosis not present

## 2023-11-20 DIAGNOSIS — Z01818 Encounter for other preprocedural examination: Secondary | ICD-10-CM

## 2023-11-20 DIAGNOSIS — I5033 Acute on chronic diastolic (congestive) heart failure: Secondary | ICD-10-CM | POA: Diagnosis not present

## 2023-11-20 DIAGNOSIS — Z01810 Encounter for preprocedural cardiovascular examination: Secondary | ICD-10-CM | POA: Diagnosis not present

## 2023-11-20 DIAGNOSIS — I5032 Chronic diastolic (congestive) heart failure: Secondary | ICD-10-CM

## 2023-11-20 DIAGNOSIS — E0590 Thyrotoxicosis, unspecified without thyrotoxic crisis or storm: Secondary | ICD-10-CM

## 2023-11-20 DIAGNOSIS — I739 Peripheral vascular disease, unspecified: Secondary | ICD-10-CM | POA: Diagnosis not present

## 2023-11-20 NOTE — Progress Notes (Signed)
Cardiology Office Note:  .   Date:  11/20/2023  ID:  Curtis Patton, DOB [birth year], MRN [id number] PCP: Deeann Saint, MD  [facility] Providers Cardiologist:  Reatha Harps, MD     History of Present Illness: .   Curtis Patton is a 58 y.o. male with past medical history of HFpEF, hypertension, hyperlipidemia, former tobacco abuse and PAD.  His PAD is being followed by Dr. Allyson Sabal.  He was initially referred to cardiology service for lifestyle limiting claudications.  ABI performed in March 2021 showed right ABI 0.74, left ABI 1.02, he had a short segment of occlusion in the mid right SFA.  He underwent angioplasty on 01/12/2020 revealing mid right SFA CTO with two-vessel runoff, he had a focal 90% mid left SFA stenosis with two-vessel runoff, however he was asymptomatic on the left side.  He underwent Hawk 1 directional atherectomy with drug-coated balloon angioplasty of the right SFA.  Postprocedure, his claudication symptom resolved.  Unfortunately, follow-up Doppler performed in November 2023 showed decline in his right SFA of 0.80 with a reocclusion of the mid right SFA.  He did have recurrence of claudication symptom however not as severe as previous symptom.  He was placed on Pletal in December 2023 by Dr. Gery Pray.  He was last seen by Curtis Crumble, PA-C on 02/11/2023 at which time he mentioned shortness of breath with exertion and also claudication symptom.  Pletal was further increased to 100 mg twice a day dosing.  Subsequent coronary CT obtained on 02/18/2023 showed indeterminate enhancing hepatic lesion, consider liver protocol CT.  Otherwise 25 to 49% mixed plaque in the mid left circumflex vessel, 1 to 24% ostial LAD lesion, very mild nonobstructive disease.  Abdominal liver MRI has been obtained, result currently pending.  He has upcoming hernia repair by Dr. Carolynne Edouard on 02/11/2023.  He presents today for preoperative clearance.  He denies any recent exertional chest pain or  worsening shortness of breath.  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  I have reviewed his previous coronary CT result.  Given the reassuring ischemic workup, he is cleared to proceed with upcoming hernia repair.  He can follow-up with Dr. Allyson Sabal in 6 months.  He has no lower extremity edema.  Ever since he started on the higher dose of Pletal, his claudication symptom is also under control as well.   ROS:   He denies chest pain, palpitations, dyspnea, pnd, orthopnea, n, v, dizziness, syncope, edema, weight gain, or early satiety. All other systems reviewed and are otherwise negative except as noted above.    Studies Reviewed: .        Cardiac Studies & Procedures      ECHOCARDIOGRAM  ECHOCARDIOGRAM COMPLETE 08/10/2018  Narrative *[place]* *[facility]* [address] [place], [place] [address] [phone]  ------------------------------------------------------------------- Transthoracic Echocardiography  Patient:    Curtis Patton, Curtis Patton MR #:       [id number] Study Date: 08/10/2018 Gender:     M Age:        52 Height:     188 cm Weight:     114.8 kg BSA:        2.48 m^2 Pt. Status: Room:       3E20C  ATTENDING    Lahoma Crocker PERFORMING   [facility], Inpatient SONOGRAPHER  Leta Jungling, RDCS ADMITTING    Odie Sera  S ORDERING     Opyd, Timothy S REFERRING    Opyd, Timothy S  cc:  ------------------------------------------------------------------- LV EF: 50% -   55%  ------------------------------------------------------------------- Indications:      Dyspnea 786.09.  ------------------------------------------------------------------- History:   PMH:  Shortness of Breath. Orthopnea.  Congestive heart failure.  Risk factors:  Hypertension.  ------------------------------------------------------------------- Study Conclusions  - Left ventricle: The cavity  size was normal. There was mild concentric hypertrophy. Systolic function was at the lower limits of normal. The estimated ejection fraction was in the range of 50% to 55%. Mild diffuse hypokinesis with no identifiable regional variations. Doppler parameters are consistent with abnormal left ventricular relaxation (grade 1 diastolic dysfunction).  ------------------------------------------------------------------- Study data:  No prior study was available for comparison.  Study status:  Routine.  Procedure:  The patient reported no pain pre or post test. Transthoracic echocardiography. Image quality was adequate.  Study completion:  There were no complications. Transthoracic echocardiography.  M-mode, complete 2D, spectral Doppler, and color Doppler.  Birthdate:  Patient birthdate: 13-May-1966.  Age:  Patient is 58 yr old.  Sex:  Gender: male. BMI: 32.5 kg/m^2.  Blood pressure:     140/83  Patient status: Inpatient.  Study date:  Study date: 08/10/2018. Study time: 09:57 AM.  Location:  Bedside.  -------------------------------------------------------------------  ------------------------------------------------------------------- Left ventricle:  The cavity size was normal. There was mild concentric hypertrophy. Systolic function was at the lower limits of normal. The estimated ejection fraction was in the range of 50% to 55%.  Mild diffuse hypokinesis with no identifiable regional variations. Doppler parameters are consistent with abnormal left ventricular relaxation (grade 1 diastolic dysfunction). There was no evidence of elevated ventricular filling pressure by Doppler parameters.  ------------------------------------------------------------------- Aortic valve:   Trileaflet; normal thickness leaflets. Mobility was not restricted.  Doppler:  Transvalvular velocity was within the normal range. There was no stenosis. There was no  regurgitation.  ------------------------------------------------------------------- Aorta:  Aortic root: The aortic root was normal in size.  ------------------------------------------------------------------- Mitral valve:   Structurally normal valve.   Mobility was not restricted.  Doppler:  Transvalvular velocity was within the normal range. There was no evidence for stenosis. There was no regurgitation.  ------------------------------------------------------------------- Left atrium:  The atrium was normal in size.  ------------------------------------------------------------------- Right ventricle:  The cavity size was normal. Wall thickness was normal. Systolic function was normal.  ------------------------------------------------------------------- Pulmonic valve:   Poorly visualized.  The valve appears to be grossly normal.    Doppler:  Transvalvular velocity was within the normal range. There was no evidence for stenosis.  ------------------------------------------------------------------- Tricuspid valve:   Structurally normal valve.    Doppler: Transvalvular velocity was within the normal range. There was trivial regurgitation.  ------------------------------------------------------------------- Pulmonary artery:   The main pulmonary artery was normal-sized. Systolic pressure could not be accurately estimated.  ------------------------------------------------------------------- Right atrium:  The atrium was normal in size.  ------------------------------------------------------------------- Pericardium:  There was no pericardial effusion.  ------------------------------------------------------------------- Systemic veins: Inferior vena cava: The vessel was normal in size.  ------------------------------------------------------------------- Measurements  Left ventricle                            Value         Reference LV ID, ED, PLAX chordal           (H)      52.8   mm     43 - 52 LV ID, ES, PLAX chordal           (  H)     38.4   mm     23 - 38 LV fx shortening, PLAX chordal    (L)     27     %      >=29 LV PW thickness, ED                       12.7   mm     ---------- IVS/LV PW ratio, ED                       1.01          <=1.3 Stroke volume, 2D                         73     ml     ---------- Stroke volume/bsa, 2D                     29     ml/m^2 ---------- LV e&', lateral                            5.66   cm/s   ---------- LV E/e&', lateral                          7.33          ---------- LV e&', medial                             4.79   cm/s   ---------- LV E/e&', medial                           8.66          ---------- LV e&', average                            5.23   cm/s   ---------- LV E/e&', average                          7.94          ----------  Ventricular septum                        Value         Reference IVS thickness, ED                         12.8   mm     ----------  LVOT                                      Value         Reference LVOT ID, S                                25     mm     ---------- LVOT area  4.91   cm^2   ---------- LVOT peak velocity, S                     99.2   cm/s   ---------- LVOT mean velocity, S                     66     cm/s   ---------- LVOT VTI, S                               14.8   cm     ----------  Aorta                                     Value         Reference Aortic root ID, ED                        35     mm     ----------  Left atrium                               Value         Reference LA ID, A-P, ES                            43     mm     ---------- LA ID/bsa, A-P                            1.73   cm/m^2 <=2.2 LA volume, ES, 1-p A4C                    27.8   ml     ---------- LA volume/bsa, ES, 1-p A4C                11.2   ml/m^2 ---------- LA volume, ES, 1-p A2C                    80     ml     ---------- LA volume/bsa, ES, 1-p A2C                 32.3   ml/m^2 ----------  Mitral valve                              Value         Reference Mitral E-wave peak velocity               41.5   cm/s   ---------- Mitral A-wave peak velocity               58.6   cm/s   ---------- Mitral deceleration time                  204    ms     150 - 230 Mitral E/A ratio, peak                    0.7           ----------  Pulmonary arteries  Value         Reference PA pressure, S, DP                        9      mm Hg  <=30  Tricuspid valve                           Value         Reference Tricuspid regurg peak velocity            119.97 cm/s   ---------- Tricuspid peak RV-RA gradient             6      mm Hg  ---------- Tricuspid maximal regurg                  119.97 cm/s   ---------- velocity, PISA  Right atrium                              Value         Reference RA ID, S-I, ES, A4C               (H)     53.8   mm     34 - 49 RA area, ES, A4C                          9.78   cm^2   8.3 - 19.5 RA volume, ES, A/L                        14.8   ml     ---------- RA volume/bsa, ES, A/L                    6      ml/m^2 ----------  Systemic veins                            Value         Reference Estimated CVP                             3      mm Hg  ----------  Right ventricle                           Value         Reference TAPSE                                     22.4   mm     ---------- RV pressure, S, DP                        9      mm Hg  <=30 RV s&', lateral, S                         15     cm/s   ----------  Legend: (L)  and  (H)  mark values outside specified reference range.  ------------------------------------------------------------------- Prepared and Electronically Authenticated  by  Thurmon Fair, MD 2019-10-22T10:52:10    CT SCANS  CT CORONARY MORPH W/CTA COR W/SCORE 02/18/2023  Addendum 02/21/2023  5:35 PM ADDENDUM REPORT: 02/21/2023 17:32  EXAM: OVER-READ INTERPRETATION  CT CHEST  The  following report is an over-read performed by radiologist Dr. Curly Shores [facility] on 02/21/2023. This over-read does not include interpretation of cardiac or coronary anatomy or pathology. The coronary CTA interpretation by the cardiologist is attached.  COMPARISON:  08/09/2018  FINDINGS: Cardiovascular:  Findings discussed in the body of the report.  Mediastinum/Nodes: No suspicious adenopathy identified. Imaged mediastinal structures are unremarkable.  Lungs/Pleura: Imaged lungs are clear. No pleural effusion or pneumothorax.  Upper Abdomen: Several scattered hepatic enhancing parenchymal lesions identified which could be assessed further with an examination of the abdomen with a liver protocol.  Musculoskeletal: No chest wall abnormality. No acute or significant osseous findings.  IMPRESSION: 1. Indeterminate enhancing hepatic lesions. Consider liver protocol CT for further evaluation. 2. Otherwise No significant extracardiac incidental findings identified.   Electronically Signed By: Layla Maw M.D. On: 02/21/2023 17:32  Narrative CLINICAL DATA:  Chest pain  EXAM: Cardiac CTA  MEDICATIONS: Sub lingual nitro. 4mg  and lopressor 100mg   13 hour prep for contrast allergy  TECHNIQUE: The patient was scanned on a Siemens Force 192 slice scanner. Gantry rotation speed was 250 msecs. Collimation was .6 mm. A 100 kV prospective scan was triggered in the ascending thoracic aorta at 140 HU's Full mA was used between 35% and 75% of the R-R interval. Average HR during the scan was bpm. The 3D data set was interpreted on a dedicated work station using MPR, MIP and VRT modes. A total of 80cc of contrast was used.  FINDINGS: Non-cardiac: See separate report from [facility]. No significant findings on limited lung and soft tissue windows.  Calcium Score: Mild calcium noted in ostial LAD and LcX  LM 0  LAD 37.8  LCX 11.9  RCA  0  Total 49.7  Coronary Arteries: Right dominant with no anomalies  LM: Normal  LAD: Ostial LAD 1-24% calcified plaque  D1: Normal  D2: Normal  D3: Normal  Circumflex: 25-49% mixed plaque in mid vessel  OM1: Normal  OM2: Normal  RCA: Normal  PDA: Normal  PLA: Normal  IMPRESSION: 1.  Calcium score 49.7 which is 65 th percentile for age/sex  2.  Normal ascending thoracic aorta 3.5 cm  3.  CAD RADS 2 non obstructive CAD see description above  Charlton Haws  Electronically Signed: By: Charlton Haws M.D. On: 02/18/2023 10:07          Risk Assessment/Calculations:             Physical Exam:   VS:  BP 116/78 (BP Location: Left Arm, Patient Position: Sitting, Cuff Size: Large)   Pulse 81   Wt 227 lb 3.2 oz (103.1 kg)   SpO2 91%   BMI 29.17 kg/m    Wt Readings from Last 3 Encounters:  11/20/23 227 lb 3.2 oz (103.1 kg)  11/12/23 223 lb 12.8 oz (101.5 kg)  02/11/23 230 lb (104.3 kg)    GEN: Well nourished, well developed in no acute distress NECK: No JVD; No carotid bruits CARDIAC: RRR, no murmurs, rubs, gallops RESPIRATORY:  Clear to auscultation without rales, wheezing or rhonchi  ABDOMEN: Soft, non-tender, non-distended EXTREMITIES:  No edema; No deformity   ASSESSMENT AND PLAN: .    Preoperative clearance: Patient has upcoming hernia repair.  He denies any chest  pain.  His coronary CT less than a year ago showed minimal coronary artery disease.  He is at acceptable risk to proceed from the cardiac perspective.  He may hold aspirin and Pletal for 1 week prior to the procedure and restart as well as possible afterward at the surgeon's discretion.  Peripheral Arterial Disease (PAD) History of angioplasty in 2021 with reocclusion of the mid right SFA. Claudication symptoms have improved with Pletal therapy. -Continue Pletal 100mg  twice daily. -Hold Pletal one week prior to hernia surgery.  Chronic diastolic heart failure: Euvolemic on exam  Liver  Lesion Indeterminate enhancing hepatic lesion noted on coronary CT. Follow-up liver MRI pending.        Dispo: Follow-up with Dr. Allyson Sabal in 6 months  Signed, Azalee Course, [name]

## 2023-11-20 NOTE — Patient Instructions (Addendum)
Medication Instructions:  NO CHANGES *If you need a refill on your cardiac medications before your next appointment, please call your pharmacy*   Lab Work: NO LABS If you have labs (blood work) drawn today and your tests are completely normal, you will receive your results only by: MyChart Message (if you have MyChart) OR A paper copy in the mail If you have any lab test that is abnormal or we need to change your treatment, we will call you to review the results.   Testing/Procedures: NO TESTING   Follow-Up: At [facility], you and your health needs are our priority.  As part of our continuing mission to provide you with exceptional heart care, we have created designated Provider Care Teams.  These Care Teams include your primary Cardiologist (physician) and Advanced Practice Providers (APPs -  Physician Assistants and Nurse Practitioners) who all work together to provide you with the care you need, when you need it.   Your next appointment:   6 month(s)  Provider:   Nanetta Batty, MD   Other Instructions May hold Pletal and Aspirin for 1 week prior to procedure.

## 2023-11-20 NOTE — Telephone Encounter (Signed)
   Patient Name: Curtis Patton  DOB: 05-Jan-1966 MRN: [id number]  Primary Cardiologist: Nanetta Batty, MD  Chart reviewed as part of pre-operative protocol coverage. Given past medical history and time since last visit, based on ACC/AHA guidelines, Curtis Patton is at acceptable risk for the planned procedure without further cardiovascular testing.   Patient was seen in the cardiology office on 11/20/2023 at which time he was doing well without any exertional chest pain or shortness of breath.  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  He is at acceptable risk to proceed with upcoming surgery from the cardiac perspective.  If needed, he can hold aspirin and Pletal for 7 days prior to the procedure and restart as soon as possible afterward at the surgeon's discretion.  The patient was advised that if he develops new symptoms prior to surgery to contact our office to arrange for a follow-up visit, and he verbalized understanding.  I will route this recommendation to the requesting party via Epic fax function and remove from pre-op pool.  Please call with questions.  [place], [name] 11/20/2023, 10:51 AM

## 2023-11-21 DIAGNOSIS — E782 Mixed hyperlipidemia: Secondary | ICD-10-CM | POA: Insufficient documentation

## 2023-11-21 DIAGNOSIS — R7303 Prediabetes: Secondary | ICD-10-CM | POA: Insufficient documentation

## 2023-11-21 DIAGNOSIS — K429 Umbilical hernia without obstruction or gangrene: Secondary | ICD-10-CM | POA: Insufficient documentation

## 2023-11-24 ENCOUNTER — Telehealth: Payer: Self-pay

## 2023-11-24 NOTE — Telephone Encounter (Signed)
 Copied from CRM [id number]. Topic: Appointments - Appointment Scheduling >> Nov 24, 2023  5:00 PM Tiffany H wrote: Patient/patient representative is calling to schedule an appointment. Refer to attachments for appointment information.   Patient advised that he's having surgery on March 3rd. Patient would like to know if scheduling will impact his surgery. Please assist.

## 2023-11-25 ENCOUNTER — Encounter: Payer: Self-pay | Admitting: General Surgery

## 2023-11-25 NOTE — Telephone Encounter (Signed)
Spoke with patient patient is sch for labs

## 2023-12-10 ENCOUNTER — Other Ambulatory Visit (INDEPENDENT_AMBULATORY_CARE_PROVIDER_SITE_OTHER): Payer: 59

## 2023-12-10 ENCOUNTER — Other Ambulatory Visit: Payer: 59

## 2023-12-10 DIAGNOSIS — E0590 Thyrotoxicosis, unspecified without thyrotoxic crisis or storm: Secondary | ICD-10-CM | POA: Diagnosis not present

## 2023-12-10 LAB — TSH: TSH: 0.26 u[IU]/mL — ABNORMAL LOW (ref 0.35–5.50)

## 2023-12-10 LAB — T4, FREE: Free T4: 0.71 ng/dL (ref 0.60–1.60)

## 2023-12-10 LAB — T3, FREE: T3, Free: 3.2 pg/mL (ref 2.3–4.2)

## 2023-12-10 NOTE — Addendum Note (Signed)
Addended by: Gertie Baron D on: 12/10/2023 10:09 AM   Modules accepted: Orders

## 2023-12-14 ENCOUNTER — Encounter (HOSPITAL_BASED_OUTPATIENT_CLINIC_OR_DEPARTMENT_OTHER): Payer: Self-pay | Admitting: General Surgery

## 2023-12-15 ENCOUNTER — Other Ambulatory Visit: Payer: Self-pay | Admitting: Family Medicine

## 2023-12-15 DIAGNOSIS — I5033 Acute on chronic diastolic (congestive) heart failure: Secondary | ICD-10-CM

## 2023-12-15 DIAGNOSIS — I10 Essential (primary) hypertension: Secondary | ICD-10-CM

## 2023-12-18 MED ORDER — CHLORHEXIDINE GLUCONATE CLOTH 2 % EX PADS: 6.0000 | MEDICATED_PAD | Freq: Once | CUTANEOUS | Status: DC

## 2023-12-18 MED ORDER — CHLORHEXIDINE GLUCONATE CLOTH 2 % EX PADS
6.0000 | MEDICATED_PAD | Freq: Once | CUTANEOUS | Status: DC
Start: 2023-12-18 — End: 2023-12-21

## 2023-12-18 NOTE — Progress Notes (Signed)

## 2023-12-21 ENCOUNTER — Ambulatory Visit (HOSPITAL_BASED_OUTPATIENT_CLINIC_OR_DEPARTMENT_OTHER)
Admission: RE | Admit: 2023-12-21 | Discharge: 2023-12-21 | Disposition: A | Discharge status: Home / Self Care | Payer: 59 | Attending: General Surgery | Admitting: General Surgery

## 2023-12-21 ENCOUNTER — Encounter: Payer: Self-pay | Admitting: Family Medicine

## 2023-12-21 ENCOUNTER — Encounter (HOSPITAL_BASED_OUTPATIENT_CLINIC_OR_DEPARTMENT_OTHER)
Admission: RE | Disposition: A | Discharge status: Home / Self Care | Payer: Self-pay | Source: Home / Self Care | Attending: General Surgery

## 2023-12-21 ENCOUNTER — Ambulatory Visit (HOSPITAL_BASED_OUTPATIENT_CLINIC_OR_DEPARTMENT_OTHER): Admitting: Anesthesiology

## 2023-12-21 ENCOUNTER — Other Ambulatory Visit: Payer: Self-pay

## 2023-12-21 ENCOUNTER — Encounter (HOSPITAL_BASED_OUTPATIENT_CLINIC_OR_DEPARTMENT_OTHER): Payer: Self-pay | Admitting: General Surgery

## 2023-12-21 DIAGNOSIS — I509 Heart failure, unspecified: Secondary | ICD-10-CM | POA: Diagnosis not present

## 2023-12-21 DIAGNOSIS — K429 Umbilical hernia without obstruction or gangrene: Secondary | ICD-10-CM

## 2023-12-21 DIAGNOSIS — I110 Hypertensive heart disease with heart failure: Secondary | ICD-10-CM | POA: Insufficient documentation

## 2023-12-21 DIAGNOSIS — Z01818 Encounter for other preprocedural examination: Secondary | ICD-10-CM

## 2023-12-21 DIAGNOSIS — R160 Hepatomegaly, not elsewhere classified: Secondary | ICD-10-CM | POA: Insufficient documentation

## 2023-12-21 DIAGNOSIS — G4733 Obstructive sleep apnea (adult) (pediatric): Secondary | ICD-10-CM

## 2023-12-21 DIAGNOSIS — Z8249 Family history of ischemic heart disease and other diseases of the circulatory system: Secondary | ICD-10-CM | POA: Insufficient documentation

## 2023-12-21 DIAGNOSIS — G4730 Sleep apnea, unspecified: Secondary | ICD-10-CM | POA: Insufficient documentation

## 2023-12-21 DIAGNOSIS — Z87891 Personal history of nicotine dependence: Secondary | ICD-10-CM | POA: Insufficient documentation

## 2023-12-21 DIAGNOSIS — I739 Peripheral vascular disease, unspecified: Secondary | ICD-10-CM | POA: Diagnosis not present

## 2023-12-21 HISTORY — PX: UMBILICAL HERNIA REPAIR: SHX196

## 2023-12-21 SURGERY — REPAIR, HERNIA, UMBILICAL, ADULT
Anesthesia: General | Site: Abdomen

## 2023-12-21 MED ORDER — ACETAMINOPHEN 500 MG PO TABS: 1000.0000 mg | ORAL_TABLET | ORAL | Status: AC

## 2023-12-21 MED ORDER — LIDOCAINE 2% (20 MG/ML) 5 ML SYRINGE
INTRAMUSCULAR | Status: AC
  Filled 2023-12-21: qty 5

## 2023-12-21 MED ORDER — PHENYLEPHRINE 80 MCG/ML (10ML) SYRINGE FOR IV PUSH (FOR BLOOD PRESSURE SUPPORT)
PREFILLED_SYRINGE | INTRAVENOUS | Status: AC
  Filled 2023-12-21: qty 10

## 2023-12-21 MED ORDER — GABAPENTIN 300 MG PO CAPS
ORAL_CAPSULE | ORAL | Status: AC
  Filled 2023-12-21: qty 1

## 2023-12-21 MED ORDER — BUPIVACAINE-EPINEPHRINE (PF) 0.25% -1:200000 IJ SOLN
INTRAMUSCULAR | Status: AC
  Filled 2023-12-21: qty 30

## 2023-12-21 MED ORDER — ACETAMINOPHEN 500 MG PO TABS
1000.0000 mg | ORAL_TABLET | Freq: Once | ORAL | Status: AC
  Administered 2023-12-21: 1000 mg via ORAL

## 2023-12-21 MED ORDER — OXYCODONE HCL 5 MG PO TABS: 5.0000 mg | ORAL_TABLET | Freq: Four times a day (QID) | ORAL | 0 refills | Status: AC | PRN

## 2023-12-21 MED ORDER — FENTANYL CITRATE (PF) 100 MCG/2ML IJ SOLN
INTRAMUSCULAR | Status: DC | PRN
  Administered 2023-12-21: 25 ug via INTRAVENOUS
  Administered 2023-12-21: 50 ug via INTRAVENOUS
  Administered 2023-12-21: 25 ug via INTRAVENOUS

## 2023-12-21 MED ORDER — HYDROMORPHONE HCL 1 MG/ML IJ SOLN
INTRAMUSCULAR | Status: AC
  Filled 2023-12-21: qty 0.5

## 2023-12-21 MED ORDER — ROCURONIUM 10MG/ML (10ML) SYRINGE FOR MEDFUSION PUMP - OPTIME
INTRAVENOUS | Status: DC | PRN
  Administered 2023-12-21: 50 mg via INTRAVENOUS

## 2023-12-21 MED ORDER — HYDROMORPHONE HCL 1 MG/ML IJ SOLN
0.2500 mg | INTRAMUSCULAR | Status: DC | PRN
  Administered 2023-12-21 (×2): 0.5 mg via INTRAVENOUS

## 2023-12-21 MED ORDER — FENTANYL CITRATE (PF) 100 MCG/2ML IJ SOLN
INTRAMUSCULAR | Status: AC
  Filled 2023-12-21: qty 2

## 2023-12-21 MED ORDER — CEFAZOLIN SODIUM-DEXTROSE 2-4 GM/100ML-% IV SOLN
INTRAVENOUS | Status: AC
  Filled 2023-12-21: qty 100

## 2023-12-21 MED ORDER — SUGAMMADEX SODIUM 200 MG/2ML IV SOLN
INTRAVENOUS | Status: DC | PRN
  Administered 2023-12-21: 400 mg via INTRAVENOUS

## 2023-12-21 MED ORDER — LACTATED RINGERS IV SOLN: INTRAVENOUS | Status: DC

## 2023-12-21 MED ORDER — PROPOFOL 10 MG/ML IV BOLUS
INTRAVENOUS | Status: DC | PRN
  Administered 2023-12-21: 150 ug via INTRAVENOUS

## 2023-12-21 MED ORDER — LIDOCAINE 2% (20 MG/ML) 5 ML SYRINGE
INTRAMUSCULAR | Status: DC | PRN
  Administered 2023-12-21: 60 mg via INTRAVENOUS

## 2023-12-21 MED ORDER — MIDAZOLAM HCL 5 MG/5ML IJ SOLN
INTRAMUSCULAR | Status: DC | PRN
  Administered 2023-12-21: 1 mg via INTRAVENOUS

## 2023-12-21 MED ORDER — SUGAMMADEX SODIUM 200 MG/2ML IV SOLN
2.0000 mg/kg | Freq: Once | INTRAVENOUS | Status: DC
  Filled 2023-12-21: qty 6

## 2023-12-21 MED ORDER — PHENYLEPHRINE HCL-NACL 20-0.9 MG/250ML-% IV SOLN
INTRAVENOUS | Status: DC | PRN
  Administered 2023-12-21 (×2): 160 ug via INTRAVENOUS

## 2023-12-21 MED ORDER — ONDANSETRON HCL 4 MG/2ML IJ SOLN
INTRAMUSCULAR | Status: AC
  Filled 2023-12-21: qty 2

## 2023-12-21 MED ORDER — ROCURONIUM BROMIDE 10 MG/ML (PF) SYRINGE
PREFILLED_SYRINGE | INTRAVENOUS | Status: AC
  Filled 2023-12-21: qty 10

## 2023-12-21 MED ORDER — MIDAZOLAM HCL 2 MG/2ML IJ SOLN
INTRAMUSCULAR | Status: AC
  Filled 2023-12-21: qty 2

## 2023-12-21 MED ORDER — CEFAZOLIN SODIUM-DEXTROSE 2-4 GM/100ML-% IV SOLN
2.0000 g | INTRAVENOUS | Status: AC
  Administered 2023-12-21: 2 g via INTRAVENOUS

## 2023-12-21 MED ORDER — BUPIVACAINE-EPINEPHRINE 0.25% -1:200000 IJ SOLN
INTRAMUSCULAR | Status: DC | PRN
  Administered 2023-12-21: 20 mL

## 2023-12-21 MED ORDER — OXYCODONE HCL 5 MG PO TABS
ORAL_TABLET | ORAL | Status: AC
  Filled 2023-12-21: qty 1

## 2023-12-21 MED ORDER — GABAPENTIN 100 MG PO CAPS
100.0000 mg | ORAL_CAPSULE | ORAL | Status: AC
  Administered 2023-12-21: 100 mg via ORAL

## 2023-12-21 MED ORDER — ONDANSETRON HCL 4 MG/2ML IJ SOLN
INTRAMUSCULAR | Status: DC | PRN
  Administered 2023-12-21: 4 mg via INTRAVENOUS

## 2023-12-21 MED ORDER — ACETAMINOPHEN 500 MG PO TABS
ORAL_TABLET | ORAL | Status: AC
  Filled 2023-12-21: qty 2

## 2023-12-21 MED ORDER — OXYCODONE HCL 5 MG PO TABS
5.0000 mg | ORAL_TABLET | Freq: Once | ORAL | Status: AC
  Administered 2023-12-21: 5 mg via ORAL

## 2023-12-21 SURGICAL SUPPLY — 29 items
BLADE CLIPPER SURG (BLADE) IMPLANT
BLADE SURG 15 STRL LF DISP TIS (BLADE) ×1 IMPLANT
CHLORAPREP W/TINT 26 (MISCELLANEOUS) ×1 IMPLANT
COVER BACK TABLE 60X90IN (DRAPES) ×1 IMPLANT
COVER MAYO STAND STRL (DRAPES) ×1 IMPLANT
DERMABOND ADVANCED .7 DNX12 (GAUZE/BANDAGES/DRESSINGS) ×1 IMPLANT
DRAPE LAPAROTOMY TRNSV 102X78 (DRAPES) ×1 IMPLANT
DRAPE UTILITY XL STRL (DRAPES) ×1 IMPLANT
ELECT COATED BLADE 2.86 ST (ELECTRODE) ×1 IMPLANT
ELECT REM PT RETURN 9FT ADLT (ELECTROSURGICAL) ×1 IMPLANT
ELECTRODE REM PT RTRN 9FT ADLT (ELECTROSURGICAL) ×1 IMPLANT
GLOVE BIO SURGEON STRL SZ 6.5 (GLOVE) IMPLANT
GLOVE BIO SURGEON STRL SZ7.5 (GLOVE) ×1 IMPLANT
GLOVE BIOGEL PI IND STRL 6.5 (GLOVE) IMPLANT
GOWN STRL REUS W/ TWL LRG LVL3 (GOWN DISPOSABLE) ×2 IMPLANT
MESH OVITEX 1S PERM 6X10 6L (Mesh General) IMPLANT
NDL HYPO 25X1 1.5 SAFETY (NEEDLE) ×1 IMPLANT
NEEDLE HYPO 25X1 1.5 SAFETY (NEEDLE) ×1 IMPLANT
NS IRRIG 1000ML POUR BTL (IV SOLUTION) IMPLANT
PACK BASIN DAY SURGERY FS (CUSTOM PROCEDURE TRAY) ×1 IMPLANT
PENCIL SMOKE EVACUATOR (MISCELLANEOUS) ×1 IMPLANT
SLEEVE SCD COMPRESS KNEE MED (STOCKING) IMPLANT
SPIKE FLUID TRANSFER (MISCELLANEOUS) ×1 IMPLANT
SPONGE T-LAP 18X18 ~~LOC~~+RFID (SPONGE) ×1 IMPLANT
SUT MON AB 4-0 PC3 18 (SUTURE) ×1 IMPLANT
SUT NOVA NAB GS-21 1 T12 (SUTURE) ×1 IMPLANT
SUT VIC AB 2-0 SH 27XBRD (SUTURE) ×1 IMPLANT
SYR CONTROL 10ML LL (SYRINGE) ×1 IMPLANT
TOWEL GREEN STERILE FF (TOWEL DISPOSABLE) ×2 IMPLANT

## 2023-12-21 NOTE — Op Note (Addendum)
 12/21/2023  11:19 AM  PATIENT:  Curtis Patton  58 y.o. male  PRE-OPERATIVE DIAGNOSIS:  UMBILICAL HERNIA  POST-OPERATIVE DIAGNOSIS:  UMBILICAL HERNIA, 2cm  PROCEDURE:  Procedure(s): OPEN UMBILICAL HERNIA REPAIR WITH MESH (N/A)  SURGEON:  Surgeons and Role:    * Griselda Miner, MD - Primary  PHYSICIAN ASSISTANT:   ASSISTANTS: none   ANESTHESIA:   local and general  EBL:  minimal   BLOOD ADMINISTERED:none  DRAINS: none   LOCAL MEDICATIONS USED:  MARCAINE     SPECIMEN:  No Specimen  DISPOSITION OF SPECIMEN:  N/A  COUNTS:  YES  TOURNIQUET:  * No tourniquets in log *  DICTATION: .Dragon Dictation  After informed consent was obtained the patient was brought to the operating room and placed in the supine position on the operating table.  After adequate induction of general anesthesia the patient's abdomen was prepped with ChloraPrep, allowed to dry, and draped in usual sterile manner.  An appropriate timeout was performed.  The area around the umbilicus was infiltrated with quarter percent Marcaine.  A small curvilinear incision was made transversely at the lower edge of the umbilicus with a 15 blade knife.  The incision was carried through the skin and subcutaneous tissue sharply with the electrocautery until the fascia of the abdominal wall was encountered.  The hernia sac was opened sharply with the electrocautery.  There appeared to only be some preperitoneal fat within the hernia sac.  This was dissected free from the wall of the sac and reduced without difficulty.  The fascial edges were then cleaned and appeared healthy and thick. The hernia measured 2cm.  I then used a Ovitex 1S permanent 6 x 10 cm piece of mesh.  I cut it to the appropriate size.  The mesh was then stitched in the subfascial plane with four #1 Novafil use stitches.  Once this was accomplished the mesh was in good position.  The fascia of the anterior abdominal wall was then closed with a couple of  interrupted #1 Novafil stitches incorporating part of the mesh.  Once this was accomplished then the hernia seem to be very well repaired.  The wound was irrigated with copious amounts of saline.  The base of the umbilicus was tacked back to the fascia with a 3-0 Vicryl stitch.  The subcutaneous tissue was closed with interrupted 3-0 Vicryl stitches.  The skin was then closed with a running 4-0 Monocryl subcuticular stitch.  Dermabond dressings were applied.  The patient tolerated the procedure well.  At the end of the case all needle sponge and instrument counts were correct.  The patient was then awakened and taken to recovery in stable condition.  PLAN OF CARE: Discharge to home after PACU  PATIENT DISPOSITION:  PACU - hemodynamically stable.   Delay start of Pharmacological VTE agent (>24hrs) due to surgical blood loss or risk of bleeding: not applicable

## 2023-12-21 NOTE — Interval H&P Note (Signed)
 History and Physical Interval Note:  12/21/2023 10:07 AM  Curtis Patton  has presented today for surgery, with the diagnosis of UMBILICAL HERNIA.  The various methods of treatment have been discussed with the patient and family. After consideration of risks, benefits and other options for treatment, the patient has consented to  Procedure(s): OPEN UMBILICAL HERNIA REPAIR WITH MESH (N/A) as a surgical intervention.  The patient's history has been reviewed, patient examined, no change in status, stable for surgery.  I have reviewed the patient's chart and labs.  Questions were answered to the patient's satisfaction.     Chevis Pretty III

## 2023-12-21 NOTE — Anesthesia Procedure Notes (Signed)
 Procedure Name: Intubation Date/Time: 12/21/2023 10:32 AM  Performed by: Yolanda Bonine, CRNAPre-anesthesia Checklist: Patient identified, Emergency Drugs available, Suction available and Patient being monitored Patient Re-evaluated:Patient Re-evaluated prior to induction Oxygen Delivery Method: Circle system utilized Preoxygenation: Pre-oxygenation with 100% oxygen Induction Type: Combination inhalational/ intravenous induction Ventilation: Mask ventilation without difficulty and Oral airway inserted - appropriate to patient size Laryngoscope Size: Mac and 3 Tube type: Oral Tube size: 7.0 mm Number of attempts: 1 Airway Equipment and Method: Stylet Placement Confirmation: ETT inserted through vocal cords under direct vision, positive ETCO2 and breath sounds checked- equal and bilateral Secured at: 23 cm Tube secured with: Tape Dental Injury: Teeth and Oropharynx as per pre-operative assessment

## 2023-12-21 NOTE — Anesthesia Postprocedure Evaluation (Signed)
 Anesthesia Post Note  Patient: Curtis Patton  Procedure(s) Performed: OPEN UMBILICAL HERNIA REPAIR WITH MESH (Abdomen)     Patient location during evaluation: PACU Anesthesia Type: General Level of consciousness: awake and alert Pain management: pain level controlled Vital Signs Assessment: post-procedure vital signs reviewed and stable Respiratory status: spontaneous breathing, nonlabored ventilation and respiratory function stable Cardiovascular status: blood pressure returned to baseline and stable Postop Assessment: no apparent nausea or vomiting Anesthetic complications: no  No notable events documented.  Last Vitals:  Vitals:   12/21/23 1200 12/21/23 1219  BP: (!) 139/90 (!) 142/95  Pulse: 66 67  Resp: 14 20  Temp:  (!) 36.3 C  SpO2: 94% 95%    Last Pain:  Vitals:   12/21/23 1219  TempSrc: Temporal  PainSc:                  Evon Lopezperez,W. EDMOND

## 2023-12-21 NOTE — H&P (Signed)
 REFERRING PHYSICIAN: Deeann Saint, MD PROVIDER: Lindell Noe, MD MRN: [id number] DOB: [birth year] Subjective   Chief Complaint: New Consultation  History of Present Illness: Curtis Patton is a 58 y.o. male who is seen today as an office consultation for evaluation of New Consultation  We are asked to see the patient in consultation by Dr. Abbe Amsterdam to evaluate him for an umbilical hernia. The patient is a 58 year old black male who has noticed a bulge at his bellybutton for a number of months. He has some occasional tenderness associated with it when he lifts heavy things. He denies any nausea or vomiting. His bowels move regularly and his appetite has been good. His weight has been stable. He denies any other abdominal pain. He did have a cardiac CT in May of last year that showed some liver lesions. It does not look like these have been further evaluated yet. He otherwise has some hypertension and is on a blood thinner for claudication. He is followed by Dr. Gery Pray for this and cardiology  Review of Systems: A complete review of systems was obtained from the patient. I have reviewed this information and discussed as appropriate with the patient. See HPI as well for other ROS.  ROS   Medical History: History reviewed. No pertinent past medical history.  Patient Active Problem List  Diagnosis  Umbilical hernia without obstruction or gangrene  Liver mass   History reviewed. No pertinent surgical history.   Allergies  Allergen Reactions  Iodinated Contrast Media Hives   Current Outpatient Medications on File Prior to Visit  Medication Sig Dispense Refill  amLODIPine (NORVASC) 10 MG tablet Take 1 tablet by mouth once daily  aspirin 81 MG EC tablet Take 81 mg by mouth once daily  cilostazoL (PLETAL) 100 MG tablet Take 100 mg by mouth 2 (two) times daily  rosuvastatin (CRESTOR) 20 MG tablet Take 20 mg by mouth once daily   No current facility-administered medications on  file prior to visit.   Family History  Problem Relation Age of Onset  High blood pressure (Hypertension) Mother    Social History   Tobacco Use  Smoking Status Never  Smokeless Tobacco Never    Social History   Socioeconomic History  Marital status: Married  Tobacco Use  Smoking status: Never  Smokeless tobacco: Never  Substance and Sexual Activity  Alcohol use: Never  Drug use: Never   Social Drivers of Health   Housing Stability: Unknown (11/17/2023)  Housing Stability Vital Sign  Homeless in the Last Year: No   Objective:   Vitals:  BP: (!) 142/88  Pulse: 96  Temp: 36.9 C (98.4 F)  SpO2: 98%  Weight: (!) 104.2 kg (229 lb 12.8 oz)  Height: 188 cm (6\' 2" )  PainSc: 0-No pain  PainLoc: Abdomen   Body mass index is 29.5 kg/m.  Physical Exam Constitutional:  General: He is not in acute distress. Appearance: Normal appearance.  HENT:  Head: Normocephalic and atraumatic.  Right Ear: External ear normal.  Left Ear: External ear normal.  Nose: Nose normal.  Mouth/Throat:  Mouth: Mucous membranes are moist.  Pharynx: Oropharynx is clear.  Eyes:  General: No scleral icterus. Extraocular Movements: Extraocular movements intact.  Conjunctiva/sclera: Conjunctivae normal.  Pupils: Pupils are equal, round, and reactive to light.  Cardiovascular:  Rate and Rhythm: Normal rate and regular rhythm.  Pulses: Normal pulses.  Heart sounds: Normal heart sounds.  Pulmonary:  Effort: Pulmonary effort is normal. No respiratory distress.  Breath sounds: Normal  breath sounds.  Abdominal:  General: Abdomen is flat. Bowel sounds are normal. There is no distension.  Palpations: Abdomen is soft.  Tenderness: There is no abdominal tenderness.  Comments: There is a small reducible umbilical hernia  Musculoskeletal:  General: No swelling or deformity. Normal range of motion.  Cervical back: Normal range of motion and neck supple. No tenderness.  Skin: General: Skin is  warm and dry.  Coloration: Skin is not jaundiced.  Neurological:  General: No focal deficit present.  Mental Status: He is alert and oriented to person, place, and time.  Psychiatric:  Mood and Affect: Mood normal.  Behavior: Behavior normal.     Labs, Imaging and Diagnostic Testing:  Assessment and Plan:   Diagnoses and all orders for this visit:  Umbilical hernia without obstruction or gangrene - [facility] Case Posting Request; Future  Liver mass - MRI liver with and without contrast; Future   The patient appears to have a small but symptomatic reducible umbilical hernia. Because of the risk of incarceration and strangulation I feel he would benefit from having this fixed. He would also like to have this done. I have discussed with him in detail the risks and benefits of the operation as well as some of the technical aspects including the use of mesh and the risk of chronic pain and he understands and wishes to proceed. He will need to be off his blood thinner so we will contact his cardiologist for clearance. We will also evaluate his liver given the findings from his cardiac CT earlier this year. If all of this is negative then we will move forward with surgical scheduling.

## 2023-12-21 NOTE — Transfer of Care (Signed)
 Immediate Anesthesia Transfer of Care Note  Patient: Paxon Propes  Procedure(s) Performed: OPEN UMBILICAL HERNIA REPAIR WITH MESH (Abdomen)  Patient Location: PACU  Anesthesia Type:General  Level of Consciousness: drowsy  Airway & Oxygen Therapy: Patient Spontanous Breathing and Patient connected to face mask oxygen  Post-op Assessment: Report given to RN and Post -op Vital signs reviewed and stable  Post vital signs: Reviewed and stable  Last Vitals:  Vitals Value Taken Time  BP 140/86 12/21/23 1127  Temp    Pulse 64 12/21/23 1129  Resp 18 12/21/23 1129  SpO2 96 % 12/21/23 1129  Vitals shown include unfiled device data.  Last Pain:  Vitals:   12/21/23 0849  TempSrc: Temporal  PainSc: 0-No pain      Patients Stated Pain Goal: 3 (12/21/23 0849)  Complications: No notable events documented.

## 2023-12-21 NOTE — Discharge Instructions (Signed)
  Post Anesthesia Home Care Instructions  Activity: Get plenty of rest for the remainder of the day. A responsible individual must stay with you for 24 hours following the procedure.  For the next 24 hours, DO NOT: -Drive a car -Advertising copywriter -Drink alcoholic beverages -Take any medication unless instructed by your physician -Make any legal decisions or sign important papers.  Meals: Start with liquid foods such as gelatin or soup. Progress to regular foods as tolerated. Avoid greasy, spicy, heavy foods. If nausea and/or vomiting occur, drink only clear liquids until the nausea and/or vomiting subsides. Call your physician if vomiting continues.  Special Instructions/Symptoms: Your throat may feel dry or sore from the anesthesia or the breathing tube placed in your throat during surgery. If this causes discomfort, gargle with warm salt water. The discomfort should disappear within 24 hours.  If you had a scopolamine patch placed behind your ear for the management of post- operative nausea and/or vomiting:  1. The medication in the patch is effective for 72 hours, after which it should be removed.  Wrap patch in a tissue and discard in the trash. Wash hands thoroughly with soap and water. 2. You may remove the patch earlier than 72 hours if you experience unpleasant side effects which may include dry mouth, dizziness or visual disturbances. 3. Avoid touching the patch. Wash your hands with soap and water after contact with the patch.    Next dose of tylenol if needed is at 3:00pm

## 2023-12-21 NOTE — Anesthesia Preprocedure Evaluation (Addendum)
 Anesthesia Evaluation  Patient identified by MRN, date of birth, ID band Patient awake    Reviewed: Allergy & Precautions, H&P , NPO status , Patient's Chart, lab work & pertinent test results  Airway Mallampati: II  TM Distance: >3 FB Neck ROM: Full    Dental no notable dental hx. (+) Teeth Intact, Dental Advisory Given   Pulmonary sleep apnea , former smoker   Pulmonary exam normal breath sounds clear to auscultation       Cardiovascular hypertension, Pt. on medications + Peripheral Vascular Disease and +CHF   Rhythm:Regular Rate:Normal     Neuro/Psych  Headaches  negative psych ROS   GI/Hepatic negative GI ROS, Neg liver ROS,,,  Endo/Other  negative endocrine ROS    Renal/GU Renal InsufficiencyRenal disease  negative genitourinary   Musculoskeletal   Abdominal   Peds  Hematology negative hematology ROS (+)   Anesthesia Other Findings   Reproductive/Obstetrics negative OB ROS                             Anesthesia Physical Anesthesia Plan  ASA: 3  Anesthesia Plan: General   Post-op Pain Management: Tylenol PO (pre-op)*   Induction: Intravenous  PONV Risk Score and Plan: 3 and Ondansetron, Dexamethasone and Midazolam  Airway Management Planned: Oral ETT  Additional Equipment:   Intra-op Plan:   Post-operative Plan: Extubation in OR  Informed Consent: I have reviewed the patients History and Physical, chart, labs and discussed the procedure including the risks, benefits and alternatives for the proposed anesthesia with the patient or authorized representative who has indicated his/her understanding and acceptance.     Dental advisory given  Plan Discussed with: CRNA  Anesthesia Plan Comments:        Anesthesia Quick Evaluation

## 2023-12-22 ENCOUNTER — Encounter (HOSPITAL_BASED_OUTPATIENT_CLINIC_OR_DEPARTMENT_OTHER): Payer: Self-pay | Admitting: General Surgery

## 2024-01-25 ENCOUNTER — Other Ambulatory Visit: Payer: Self-pay | Admitting: Physician Assistant

## 2024-02-29 ENCOUNTER — Other Ambulatory Visit: Payer: Self-pay

## 2024-02-29 ENCOUNTER — Other Ambulatory Visit: Payer: Self-pay | Admitting: *Deleted

## 2024-02-29 MED ORDER — CILOSTAZOL 100 MG PO TABS: 100.0000 mg | ORAL_TABLET | Freq: Two times a day (BID) | ORAL | 2 refills | Status: AC

## 2024-02-29 MED ORDER — ROSUVASTATIN CALCIUM 40 MG PO TABS: 40.0000 mg | ORAL_TABLET | Freq: Every day | ORAL | 2 refills | Status: AC

## 2024-03-10 ENCOUNTER — Encounter: Payer: Self-pay | Admitting: Cardiovascular Disease

## 2024-07-04 ENCOUNTER — Ambulatory Visit: Admitting: Family Medicine

## 2024-07-04 ENCOUNTER — Encounter: Payer: Self-pay | Admitting: Family Medicine

## 2024-07-04 VITALS — BP 130/84 | HR 78 | Temp 98.4°F | Ht 74.0 in | Wt 218.8 lb

## 2024-07-04 DIAGNOSIS — Z23 Encounter for immunization: Secondary | ICD-10-CM

## 2024-07-04 DIAGNOSIS — L245 Irritant contact dermatitis due to other chemical products: Secondary | ICD-10-CM | POA: Diagnosis not present

## 2024-07-04 DIAGNOSIS — Z566 Other physical and mental strain related to work: Secondary | ICD-10-CM | POA: Diagnosis not present

## 2024-07-04 DIAGNOSIS — Z122 Encounter for screening for malignant neoplasm of respiratory organs: Secondary | ICD-10-CM

## 2024-07-04 DIAGNOSIS — Z87891 Personal history of nicotine dependence: Secondary | ICD-10-CM

## 2024-07-04 MED ORDER — TRIAMCINOLONE ACETONIDE 0.1 % EX OINT: 1.0000 | TOPICAL_OINTMENT | Freq: Two times a day (BID) | CUTANEOUS | 0 refills | Status: AC

## 2024-07-04 NOTE — Progress Notes (Signed)
 Established Patient Office Visit   Subjective  Patient ID: Curtis Patton, male    DOB: 1966/05/18  Age: 57 y.o. MRN: [id number]  Chief Complaint  Patient presents with   Medical Management of Chronic Issues    Patient cme in today for Anxiety, patient is stressed for work, patient has a knot on the right wrist that started 2 weeks ago, itching     Pt is a 58 yo male seen for ongoing concern.  Pt endorses increased stress at work due to his principal micro-managing him.  Pt states she is being difficult and making his job harder.  He is constantly having to work on his anger as he feels he may go off on her.  Other ppl at work are also having issues with her.  Pt considering transferring.  Notes feeling anxiety on Sundays about having to go in on Mondays.  Pt with a bump R hand x 1-2 wks.  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  May have appeared after taking some ceiling tiles down without gloves.  Area is pruritic.  Pt scratched it and feels like smaller bumps have appeared next to the larger one.  Pt interested in a flu vaccine.  Patient is a former smoker.  States smoked at least 30 years 1/2-1 pack/day.  Quit 2 years ago.  Interested in low-dose CT for lung cancer screening.    Patient Active Problem List   Diagnosis Date Noted   Umbilical hernia without obstruction and without gangrene 11/21/2023   Mixed hyperlipidemia 11/21/2023   Prediabetes 11/21/2023   Mild obstructive sleep apnea 02/06/2023   Former smoker 02/06/2023   Snoring 12/22/2022   Restless sleeper 12/22/2022   Peripheral arterial disease (HCC) 01/04/2020   Acute respiratory failure with hypoxia (HCC) 08/10/2018   Acute on chronic diastolic CHF (congestive heart failure) (HCC) 08/10/2018   Acute diastolic CHF (congestive heart failure), NYHA class 3 (HCC)    Hypertensive crisis    Acute CHF (congestive heart failure) (HCC) 08/09/2018   Hypertension 08/09/2018   Hypertensive urgency  08/09/2018   Mild renal insufficiency 08/09/2018   Acute CHF (HCC) 08/09/2018   Pilar cyst 04/12/2013   Past Medical History:  Diagnosis Date   (HFpEF) heart failure with preserved ejection fraction (HCC)    a. Echo 08/10/2018: LVEF 50-55% w/ mild LVH, mild diffuse hypokinesis w/ no regional variation, and G1DD   Acute CHF (HCC)    Acute renal insufficiency    Acute respiratory failure with hypoxia (HCC)    Hypertension    Migraine    Past Surgical History:  Procedure Laterality Date   ABDOMINAL AORTOGRAM W/LOWER EXTREMITY N/A 01/12/2020   Procedure: ABDOMINAL AORTOGRAM W/LOWER EXTREMITY;  Surgeon: Court Dorn PARAS, MD;  Location: [facility];  Service: Cardiovascular;  Laterality: N/A;   JOINT REPLACEMENT     neck     PERIPHERAL VASCULAR ATHERECTOMY  01/12/2020   Procedure: PERIPHERAL VASCULAR ATHERECTOMY;  Surgeon: Court Dorn PARAS, MD;  Location: [facility];  Service: Cardiovascular;;  RT SFA w/DCB    ROTATOR CUFF REPAIR     SPINE SURGERY     UMBILICAL HERNIA REPAIR N/A 12/21/2023   Procedure: OPEN UMBILICAL HERNIA REPAIR WITH MESH;  Surgeon: Curvin Deward MOULD, MD;  Location: [facility];  Service: General;  Laterality: N/A;   Social History   Tobacco Use   Smoking status: Former    Current packs/day: 0.00    Average packs/day:  1 pack/day for 20.0 years (20.0 ttl pk-yrs)    Types: Cigars, Cigarettes    Start date: 10/21/2002    Quit date: 10/21/2022    Years since quitting: 1.7   Smokeless tobacco: Never  Vaping Use   Vaping status: Never Used  Substance Use Topics   Alcohol use: Yes    Alcohol/week: 6.0 standard drinks of alcohol    Types: 4 Cans of beer, 2 Shots of liquor per week    Comment: occ   Drug use: No   Family History  Problem Relation Age of Onset   Hypertension Other    Hypertension Maternal Grandmother    Sudden death Paternal Grandmother        Died suddenly in her mid-50s. Unsure of cause.   Hypertension Mother    Colon  cancer Neg Hx    Colon polyps Neg Hx    Esophageal cancer Neg Hx    Rectal cancer Neg Hx    Stomach cancer Neg Hx    Allergies  Allergen Reactions   Gadavist [Gadobutrol] Swelling    Swelling of the face    Contrast Media [Iodinated Contrast Media] Hives   Iodine  Hives    ROS Negative unless stated above    Objective:     BP 130/84 (BP Location: Left Arm, Patient Position: Sitting, Cuff Size: Large)   Pulse 78   Temp 98.4 F (36.9 C) (Oral)   Ht 6' 2 (1.88 m)   Wt 218 lb 12.8 oz (99.2 kg)   SpO2 98%   BMI 28.09 kg/m  BP Readings from Last 3 Encounters:  07/04/24 130/84  12/21/23 (!) 142/95  11/20/23 116/78   Wt Readings from Last 3 Encounters:  07/04/24 218 lb 12.8 oz (99.2 kg)  12/21/23 228 lb 9.9 oz (103.7 kg)  11/20/23 227 lb 3.2 oz (103.1 kg)      Physical Exam Constitutional:      General: He is not in acute distress.    Appearance: Normal appearance.  HENT:     Head: Normocephalic and atraumatic.     Nose: Nose normal.     Mouth/Throat:     Mouth: Mucous membranes are moist.  Cardiovascular:     Rate and Rhythm: Normal rate and regular rhythm.     Heart sounds: Normal heart sounds. No murmur heard.    No gallop.  Pulmonary:     Effort: Pulmonary effort is normal. No respiratory distress.     Breath sounds: Normal breath sounds. No wheezing, rhonchi or rales.  Skin:    General: Skin is warm and dry.     Comments: Bump with central excoriation on R lateral thenar eminence/base of R thumb.  2 smaller 2 mm lesions proximal to larger lesion.  Neurological:     Mental Status: He is alert and oriented to person, place, and time.        07/04/2024   10:55 AM 07/04/2024   10:54 AM 12/19/2022    9:00 AM  Depression screen PHQ 2/9  Decreased Interest 0 0 0  Down, Depressed, Hopeless 2 2 0  PHQ - 2 Score 2 2 0  Altered sleeping 2 2 1   Tired, decreased energy 2 2 0  Change in appetite 1 1 2   Feeling bad or failure about yourself  0 0 0  Trouble  concentrating 0 0 0  Moving slowly or fidgety/restless 0 0 1  Suicidal thoughts 0 0 0  PHQ-9 Score 7 7 4   Difficult doing work/chores  Not difficult at all  Not difficult at all      07/04/2024   10:55 AM 12/19/2022    9:00 AM 11/28/2022    9:17 AM  GAD 7 : Generalized Anxiety Score  Nervous, Anxious, on Edge 3 0 0  Control/stop worrying 2 0 0  Worry too much - different things 2 1 0  Trouble relaxing 2 1 1   Restless 2 0 0  Easily annoyed or irritable 3 2 3   Afraid - awful might happen 2 0 0  Total GAD 7 Score 16 4 4   Anxiety Difficulty Somewhat difficult  Somewhat difficult     No results found for any visits on 07/04/24.    Assessment & Plan:   Stress at work  Need for influenza vaccination -     Flu vaccine trivalent PF, 6mos and older(Flulaval,Afluria,Fluarix,Fluzone)  Screening for lung cancer -     Ambulatory Referral for Lung Cancer Scre  Irritant contact dermatitis due to other chemical products -     Triamcinolone  Acetonide; Apply 1 Application topically 2 (two) times daily.  Dispense: 30 g; Refill: 0  Former smoker -     Ambulatory Referral for Lung Cancer Scre  PHQ-9 score 7 and GAD-7 score 16 this visit.  Discussed various ways to cope/manage with stress at work including transferring.  Consider EAP.  Not interested in medication at this time.  Self-care strongly encouraged.  Continue to monitor.  Former smoker quit 2 years ago.  20+ pack-year history.  Lung cancer screening ordered.  Lesion on right hand likely 2/2 contact dermatitis from fiberglass/other particles from ceiling tile versus contact with chemicals used for cleaning.  Discussed importance of wearing gloves at work.  Triamcinolone  cream twice daily as needed.  Can also use OTC antihistamine such as Allegra, Claritin, Xyzal, etc. keep area clean and dry.  For worsening symptoms and notify clinic.  Influenza vaccine given this visit  Return if symptoms worsen or fail to improve.   Clotilda JONELLE Single, MD

## 2024-09-11 ENCOUNTER — Other Ambulatory Visit: Payer: Self-pay | Admitting: Family Medicine

## 2024-09-11 DIAGNOSIS — I10 Essential (primary) hypertension: Secondary | ICD-10-CM

## 2024-09-11 DIAGNOSIS — I5033 Acute on chronic diastolic (congestive) heart failure: Secondary | ICD-10-CM
